# Patient Record
Sex: Female | Born: 1965 | Race: Black or African American | Hispanic: No | Marital: Single | Smoking: Never smoker
Health system: Southern US, Community
[De-identification: ages and names within clinical notes are randomized; demographics above are authoritative.]

## PROBLEM LIST (undated history)

## (undated) DIAGNOSIS — IMO0002 Reserved for concepts with insufficient information to code with codable children: Secondary | ICD-10-CM

## (undated) DIAGNOSIS — Z8739 Personal history of other diseases of the musculoskeletal system and connective tissue: Secondary | ICD-10-CM

## (undated) DIAGNOSIS — N281 Cyst of kidney, acquired: Secondary | ICD-10-CM

## (undated) DIAGNOSIS — J349 Unspecified disorder of nose and nasal sinuses: Secondary | ICD-10-CM

## (undated) DIAGNOSIS — N62 Hypertrophy of breast: Secondary | ICD-10-CM

## (undated) DIAGNOSIS — G500 Trigeminal neuralgia: Secondary | ICD-10-CM

## (undated) DIAGNOSIS — E039 Hypothyroidism, unspecified: Secondary | ICD-10-CM

## (undated) DIAGNOSIS — R51 Headache: Secondary | ICD-10-CM

## (undated) DIAGNOSIS — L508 Other urticaria: Secondary | ICD-10-CM

## (undated) DIAGNOSIS — E559 Vitamin D deficiency, unspecified: Secondary | ICD-10-CM

## (undated) DIAGNOSIS — J302 Other seasonal allergic rhinitis: Secondary | ICD-10-CM

## (undated) DIAGNOSIS — E079 Disorder of thyroid, unspecified: Secondary | ICD-10-CM

## (undated) DIAGNOSIS — D563 Thalassemia minor: Secondary | ICD-10-CM

## (undated) DIAGNOSIS — Z8669 Personal history of other diseases of the nervous system and sense organs: Secondary | ICD-10-CM

## (undated) DIAGNOSIS — D219 Benign neoplasm of connective and other soft tissue, unspecified: Secondary | ICD-10-CM

## (undated) DIAGNOSIS — Z8742 Personal history of other diseases of the female genital tract: Secondary | ICD-10-CM

## (undated) DIAGNOSIS — D649 Anemia, unspecified: Secondary | ICD-10-CM

## (undated) DIAGNOSIS — M545 Low back pain, unspecified: Secondary | ICD-10-CM

## (undated) DIAGNOSIS — B373 Candidiasis of vulva and vagina: Secondary | ICD-10-CM

## (undated) DIAGNOSIS — I10 Essential (primary) hypertension: Secondary | ICD-10-CM

## (undated) DIAGNOSIS — R87619 Unspecified abnormal cytological findings in specimens from cervix uteri: Secondary | ICD-10-CM

## (undated) DIAGNOSIS — K219 Gastro-esophageal reflux disease without esophagitis: Secondary | ICD-10-CM

## (undated) DIAGNOSIS — L659 Nonscarring hair loss, unspecified: Secondary | ICD-10-CM

## (undated) HISTORY — DX: Disorder of thyroid, unspecified: E07.9

## (undated) HISTORY — DX: Reserved for concepts with insufficient information to code with codable children: IMO0002

## (undated) HISTORY — DX: Other urticaria: L50.8

## (undated) HISTORY — DX: Benign neoplasm of connective and other soft tissue, unspecified: D21.9

## (undated) HISTORY — DX: Cyst of kidney, acquired: N28.1

## (undated) HISTORY — DX: Vitamin D deficiency, unspecified: E55.9

## (undated) HISTORY — DX: Personal history of other diseases of the nervous system and sense organs: Z86.69

## (undated) HISTORY — PX: ROOT CANAL: SHX2363

## (undated) HISTORY — DX: Nonscarring hair loss, unspecified: L65.9

## (undated) HISTORY — DX: Unspecified disorder of nose and nasal sinuses: J34.9

## (undated) HISTORY — DX: Thalassemia minor: D56.3

## (undated) HISTORY — PX: WISDOM TOOTH EXTRACTION: SHX21

## (undated) HISTORY — DX: Candidiasis of vulva and vagina: B37.3

## (undated) HISTORY — DX: Personal history of other diseases of the female genital tract: Z87.42

## (undated) HISTORY — DX: Anemia, unspecified: D64.9

## (undated) HISTORY — DX: Unspecified abnormal cytological findings in specimens from cervix uteri: R87.619

## (undated) HISTORY — PX: OTHER SURGICAL HISTORY: SHX169

## (undated) HISTORY — DX: Personal history of other diseases of the musculoskeletal system and connective tissue: Z87.39

## (undated) HISTORY — PX: NASAL SINUS SURGERY: SHX719

---

## 1997-03-11 HISTORY — PX: FOOT SURGERY: SHX648

## 1997-07-17 ENCOUNTER — Emergency Department (HOSPITAL_COMMUNITY): Admission: EM | Admit: 1997-07-17 | Discharge: 1997-07-17 | Payer: Self-pay | Admitting: Emergency Medicine

## 1998-03-11 HISTORY — PX: MYOMECTOMY: SHX85

## 1998-04-18 ENCOUNTER — Inpatient Hospital Stay (HOSPITAL_COMMUNITY): Admission: RE | Admit: 1998-04-18 | Discharge: 1998-04-20 | Payer: Self-pay | Admitting: Obstetrics and Gynecology

## 1999-03-22 ENCOUNTER — Encounter: Payer: Self-pay | Admitting: *Deleted

## 1999-03-22 ENCOUNTER — Encounter: Admission: RE | Admit: 1999-03-22 | Discharge: 1999-03-22 | Payer: Self-pay | Admitting: *Deleted

## 1999-03-30 ENCOUNTER — Encounter: Payer: Self-pay | Admitting: *Deleted

## 1999-03-30 ENCOUNTER — Encounter: Admission: RE | Admit: 1999-03-30 | Discharge: 1999-03-30 | Payer: Self-pay | Admitting: *Deleted

## 1999-10-25 ENCOUNTER — Encounter: Admission: RE | Admit: 1999-10-25 | Discharge: 1999-10-25 | Payer: Self-pay | Admitting: Internal Medicine

## 1999-10-25 ENCOUNTER — Encounter: Payer: Self-pay | Admitting: Internal Medicine

## 1999-10-31 ENCOUNTER — Encounter: Payer: Self-pay | Admitting: Internal Medicine

## 1999-10-31 ENCOUNTER — Encounter: Admission: RE | Admit: 1999-10-31 | Discharge: 1999-10-31 | Payer: Self-pay | Admitting: Internal Medicine

## 2000-01-15 ENCOUNTER — Other Ambulatory Visit: Admission: RE | Admit: 2000-01-15 | Discharge: 2000-01-15 | Payer: Self-pay | Admitting: Obstetrics and Gynecology

## 2000-01-20 ENCOUNTER — Emergency Department (HOSPITAL_COMMUNITY): Admission: EM | Admit: 2000-01-20 | Discharge: 2000-01-20 | Payer: Self-pay | Admitting: Emergency Medicine

## 2000-01-20 ENCOUNTER — Encounter: Payer: Self-pay | Admitting: Emergency Medicine

## 2000-09-04 ENCOUNTER — Encounter: Payer: Self-pay | Admitting: Internal Medicine

## 2000-09-04 ENCOUNTER — Encounter: Admission: RE | Admit: 2000-09-04 | Discharge: 2000-09-04 | Payer: Self-pay | Admitting: Internal Medicine

## 2000-09-09 ENCOUNTER — Encounter: Payer: Self-pay | Admitting: Neurosurgery

## 2000-09-09 ENCOUNTER — Ambulatory Visit (HOSPITAL_COMMUNITY): Admission: RE | Admit: 2000-09-09 | Discharge: 2000-09-09 | Payer: Self-pay | Admitting: Neurosurgery

## 2000-12-09 ENCOUNTER — Encounter: Admission: RE | Admit: 2000-12-09 | Discharge: 2000-12-09 | Payer: Self-pay | Admitting: Internal Medicine

## 2000-12-09 ENCOUNTER — Encounter: Payer: Self-pay | Admitting: Internal Medicine

## 2001-03-09 ENCOUNTER — Other Ambulatory Visit: Admission: RE | Admit: 2001-03-09 | Discharge: 2001-03-09 | Payer: Self-pay | Admitting: Obstetrics and Gynecology

## 2002-04-14 ENCOUNTER — Other Ambulatory Visit: Admission: RE | Admit: 2002-04-14 | Discharge: 2002-04-14 | Payer: Self-pay | Admitting: Obstetrics and Gynecology

## 2002-06-22 ENCOUNTER — Encounter: Payer: Self-pay | Admitting: Neurosurgery

## 2002-06-22 ENCOUNTER — Encounter: Admission: RE | Admit: 2002-06-22 | Discharge: 2002-06-22 | Payer: Self-pay | Admitting: Neurosurgery

## 2002-12-01 ENCOUNTER — Encounter: Admission: RE | Admit: 2002-12-01 | Discharge: 2002-12-01 | Payer: Self-pay | Admitting: Internal Medicine

## 2002-12-01 ENCOUNTER — Encounter: Payer: Self-pay | Admitting: Internal Medicine

## 2003-08-02 ENCOUNTER — Other Ambulatory Visit: Admission: RE | Admit: 2003-08-02 | Discharge: 2003-08-02 | Payer: Self-pay | Admitting: Obstetrics and Gynecology

## 2005-07-08 ENCOUNTER — Other Ambulatory Visit: Admission: RE | Admit: 2005-07-08 | Discharge: 2005-07-08 | Payer: Self-pay | Admitting: Obstetrics and Gynecology

## 2005-11-22 ENCOUNTER — Encounter: Admission: RE | Admit: 2005-11-22 | Discharge: 2005-11-22 | Payer: Self-pay | Admitting: Internal Medicine

## 2008-03-11 DIAGNOSIS — B373 Candidiasis of vulva and vagina: Secondary | ICD-10-CM

## 2008-03-11 DIAGNOSIS — B3731 Acute candidiasis of vulva and vagina: Secondary | ICD-10-CM

## 2008-03-11 HISTORY — DX: Acute candidiasis of vulva and vagina: B37.31

## 2008-03-11 HISTORY — DX: Candidiasis of vulva and vagina: B37.3

## 2008-09-13 ENCOUNTER — Encounter: Admission: RE | Admit: 2008-09-13 | Discharge: 2008-09-13 | Payer: Self-pay | Admitting: Obstetrics and Gynecology

## 2009-09-13 ENCOUNTER — Encounter: Admission: RE | Admit: 2009-09-13 | Discharge: 2009-09-13 | Payer: Self-pay | Admitting: Obstetrics and Gynecology

## 2009-11-30 ENCOUNTER — Encounter: Admission: RE | Admit: 2009-11-30 | Discharge: 2009-11-30 | Payer: Self-pay | Admitting: Internal Medicine

## 2010-08-20 ENCOUNTER — Other Ambulatory Visit: Payer: Self-pay | Admitting: Obstetrics and Gynecology

## 2010-08-20 DIAGNOSIS — Z1231 Encounter for screening mammogram for malignant neoplasm of breast: Secondary | ICD-10-CM

## 2010-09-14 ENCOUNTER — Ambulatory Visit
Admission: RE | Admit: 2010-09-14 | Discharge: 2010-09-14 | Disposition: A | Discharge status: Home / Self Care | Payer: Self-pay | Source: Ambulatory Visit | Attending: Obstetrics and Gynecology | Admitting: Obstetrics and Gynecology

## 2010-09-14 DIAGNOSIS — Z1231 Encounter for screening mammogram for malignant neoplasm of breast: Secondary | ICD-10-CM

## 2011-02-06 DIAGNOSIS — J019 Acute sinusitis, unspecified: Secondary | ICD-10-CM | POA: Insufficient documentation

## 2011-02-06 DIAGNOSIS — J329 Chronic sinusitis, unspecified: Secondary | ICD-10-CM | POA: Insufficient documentation

## 2011-05-03 DIAGNOSIS — K219 Gastro-esophageal reflux disease without esophagitis: Secondary | ICD-10-CM | POA: Insufficient documentation

## 2011-07-16 ENCOUNTER — Telehealth: Payer: Self-pay | Admitting: Obstetrics and Gynecology

## 2011-07-19 ENCOUNTER — Telehealth: Payer: Self-pay | Admitting: Obstetrics and Gynecology

## 2011-07-26 NOTE — Telephone Encounter (Signed)
Schedule surgery and an office visit.  AVS

## 2011-08-01 ENCOUNTER — Telehealth: Payer: Self-pay | Admitting: Obstetrics and Gynecology

## 2011-08-01 ENCOUNTER — Other Ambulatory Visit: Payer: Self-pay | Admitting: Obstetrics and Gynecology

## 2011-08-01 NOTE — Telephone Encounter (Signed)
LAVH scheduled for 08/29/11 @ 9:30 with AVS/VH.  UMR effective 09/08/09.  Plan pays 80/20 after a $500 deductible. Pre-op due $331.29  -Adrianne Pridgen

## 2011-08-12 DIAGNOSIS — D219 Benign neoplasm of connective and other soft tissue, unspecified: Secondary | ICD-10-CM | POA: Insufficient documentation

## 2011-08-12 DIAGNOSIS — Z8742 Personal history of other diseases of the female genital tract: Secondary | ICD-10-CM | POA: Insufficient documentation

## 2011-08-12 DIAGNOSIS — E079 Disorder of thyroid, unspecified: Secondary | ICD-10-CM | POA: Insufficient documentation

## 2011-08-12 DIAGNOSIS — Z8739 Personal history of other diseases of the musculoskeletal system and connective tissue: Secondary | ICD-10-CM | POA: Insufficient documentation

## 2011-08-13 ENCOUNTER — Encounter: Payer: Commercial Managed Care - PPO | Admitting: Obstetrics and Gynecology

## 2011-08-13 ENCOUNTER — Telehealth: Payer: Self-pay | Admitting: Obstetrics and Gynecology

## 2011-08-13 NOTE — Telephone Encounter (Signed)
TC to pt.   States wants to speak with Dr AVS.  Declined to give further information.   Will send message to Dr AVS.

## 2011-08-14 ENCOUNTER — Telehealth: Payer: Self-pay | Admitting: Obstetrics and Gynecology

## 2011-08-19 ENCOUNTER — Encounter: Payer: Self-pay | Admitting: Obstetrics and Gynecology

## 2011-08-19 ENCOUNTER — Other Ambulatory Visit (HOSPITAL_COMMUNITY): Payer: Self-pay

## 2011-08-29 ENCOUNTER — Encounter (HOSPITAL_COMMUNITY): Admission: RE | Payer: Self-pay | Source: Ambulatory Visit

## 2011-08-29 ENCOUNTER — Ambulatory Visit (HOSPITAL_COMMUNITY)
Admission: RE | Admit: 2011-08-29 | Payer: Commercial Managed Care - PPO | Source: Ambulatory Visit | Admitting: Obstetrics and Gynecology

## 2011-08-29 SURGERY — HYSTERECTOMY, VAGINAL, LAPAROSCOPY-ASSISTED
Anesthesia: General

## 2011-09-03 ENCOUNTER — Telehealth: Payer: Self-pay | Admitting: Obstetrics and Gynecology

## 2011-09-03 NOTE — Telephone Encounter (Signed)
LAVH rescheduled to 10/17/18/13 @ 12:00 with AVS/AR  UMR effective 09/08/09.  Plan pays 80/20 after a $500 deductible. Pre-op due $331.29  -Adrianne Pridgen

## 2011-09-04 ENCOUNTER — Other Ambulatory Visit: Payer: Self-pay | Admitting: Obstetrics and Gynecology

## 2011-09-09 ENCOUNTER — Other Ambulatory Visit: Payer: Self-pay | Admitting: Internal Medicine

## 2011-09-09 ENCOUNTER — Encounter: Payer: Self-pay | Admitting: Obstetrics and Gynecology

## 2011-09-09 ENCOUNTER — Ambulatory Visit (INDEPENDENT_AMBULATORY_CARE_PROVIDER_SITE_OTHER): Payer: Commercial Managed Care - PPO | Admitting: Obstetrics and Gynecology

## 2011-09-09 ENCOUNTER — Other Ambulatory Visit: Payer: Self-pay | Admitting: Obstetrics and Gynecology

## 2011-09-09 VITALS — BP 128/84 | Temp 98.3°F | Resp 16 | Ht 64.0 in | Wt 239.0 lb

## 2011-09-09 DIAGNOSIS — Z1231 Encounter for screening mammogram for malignant neoplasm of breast: Secondary | ICD-10-CM

## 2011-09-09 DIAGNOSIS — Z01419 Encounter for gynecological examination (general) (routine) without abnormal findings: Secondary | ICD-10-CM

## 2011-09-09 NOTE — Progress Notes (Addendum)
Last Pap:09/14/2010 WNL: Yes Regular Periods:no Contraception: None  Monthly Breast exam:no Tetanus<28yrs:no Nl.Bladder Function:no/due to fibroids Daily BMs:yes Healthy Diet:no Calcium:yes Mammogram:yes Date of Mammogram: 09/14/2010 Exercise:yes Have often Exercise: Everyday Seatbelt: yes Abuse at home: no Stressful work:yes Sigmoid-colonoscopy: Never had Bone Density: No PCP: Dr. Eula Listen Change in PMH: No Change in [facility]: No  *Declines pap smear today but desires to discuss pre-op C/O pelvic pain due to fibroids   Subjective:    Michele Bean is a 46 y.o. female G0P0 who presents for annual exam. The patient complains of low back pain. She has had disc problems with her back.  She has a known history of fibroids.  She had a myomectomy in 2000.  The following portions of the patient's history were reviewed and updated as appropriate: allergies, current medications, past family history, past medical history, past social history, past surgical history and problem list. See above.  Review of Systems Pertinent items are noted in HPI. Gastrointestinal:No change in bowel habits, no abdominal pain, no rectal bleeding Genitourinary:negative for dysuria, frequency, hematuria, nocturia and urinary incontinence    Objective:     BP 128/84  Temp 98.3 F (36.8 C)  Resp 16  Ht 5\' 4"  (1.626 m)  Wt 239 lb (108.41 kg)  BMI 41.02 kg/m2  LMP 04/15/2011  Weight:  Wt Readings from Last 1 Encounters:  09/09/11 239 lb (108.41 kg)     BMI: Body mass index is 41.02 kg/(m^2). General Appearance: Alert, appropriate appearance for age. No acute distress HEENT: Grossly normal Neck / Thyroid: Supple, no masses, nodes or enlargement Lungs: clear to auscultation bilaterally Back: No CVA tenderness Breast Exam: No masses or nodes.No dimpling, nipple retraction or discharge. Cardiovascular: Regular rate and rhythm. S1, S2, no murmur Gastrointestinal: Soft, non-tender, no masses or  organomegaly  ++++++++++++++++++++++++++++++++++++++++++++++++++++++++  Pelvic Exam: External genitalia: normal general appearance Vaginal: normal mucosa without prolapse or lesions Cervix: normal appearance Adnexa: normal bimanual exam Uterus: 12 week size, irregular, firm Rectovaginal: normal rectal, no masses  ++++++++++++++++++++++++++++++++++++++++++++++++++++++++  Lymphatic Exam: Non-palpable nodes in neck, clavicular, axillary, or inguinal regions  Psychiatric: Alert and oriented, appropriate affect.    Urinalysis:Not done      Assessment:    Normal gyn exam   Fibroid uterus  Low back pain  Hypothyroidism  Low vitamin D  Overweight or obese: Yes  Pelvic relaxation: No  Menopausal symptoms: No. Severe: No.   Plan:    laparoscopy assisted vaginal hysterectomy is scheduled for August 8 of 2013.  Surgical procedure reviewed.   Follow-up:  in 1 month(s) for preop exam. Will review the need for surgery given that she is no longer having heavy bleeding . Patient referred to her family physician to evaluate her constant low back pain and possible disc problems.  STD screen request: none,  RPR: No,  HBsAg: No.  Hepatitis C: No.  The updated Pap smear screening guidelines were discussed with the patient. The patient requested that I obtain a Pap smear: No.  Kegel exercises discussed: No.  Proper diet and regular exercise were reviewed.  Annual mammograms recommended starting at age 11. Proper breast care was discussed.  Screening colonoscopy is recommended beginning at age 66.  Regular health maintenance was reviewed.  Sleep hygiene was discussed.  Adequate calcium and vitamin D intake was emphasized.  [name]

## 2011-09-13 ENCOUNTER — Ambulatory Visit
Admission: RE | Admit: 2011-09-13 | Discharge: 2011-09-13 | Disposition: A | Discharge status: Home / Self Care | Payer: Commercial Managed Care - PPO | Source: Ambulatory Visit | Attending: Internal Medicine | Admitting: Internal Medicine

## 2011-09-13 DIAGNOSIS — Z1231 Encounter for screening mammogram for malignant neoplasm of breast: Secondary | ICD-10-CM

## 2011-09-13 IMAGING — MG MM DIGITAL SCREENING BILAT
6 series · 6 of 6 positions shown · non-contrast
Comparison: Previous exams

CLINICAL DATA: Screening.

DIGITAL SCREENING MAMMOGRAM WITH CAD

[R CC (1 of 2)]
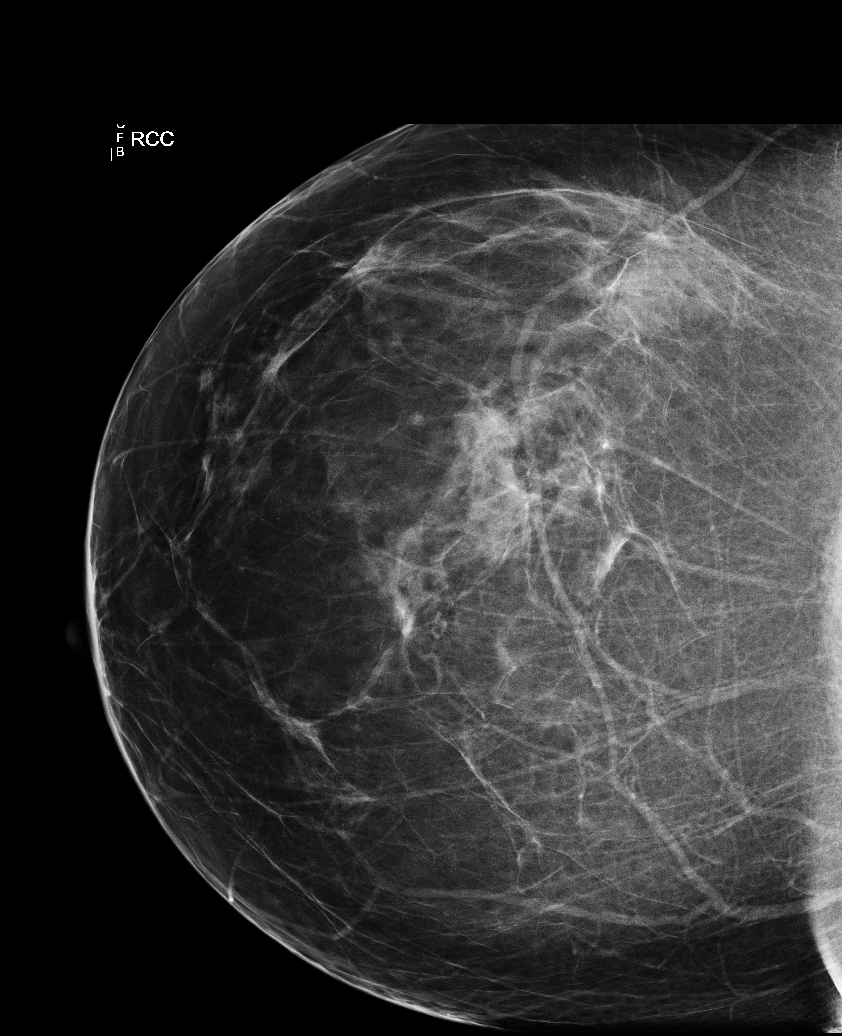

[L CC]
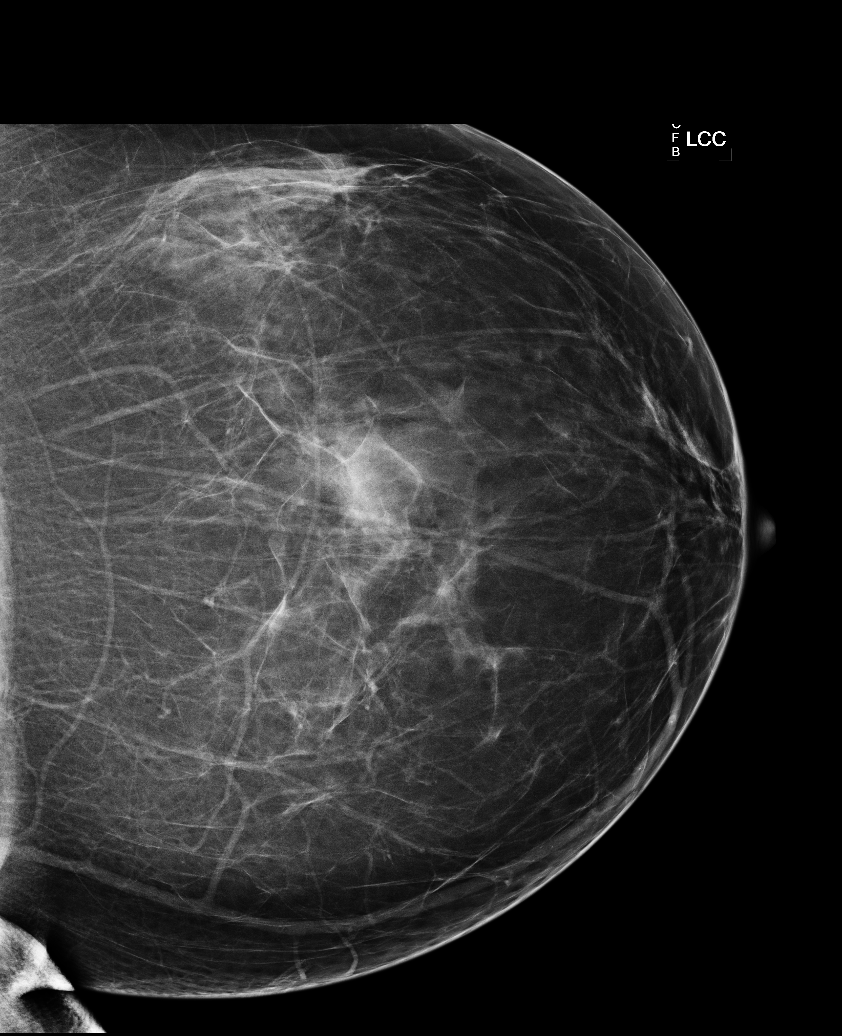

[L MLO]
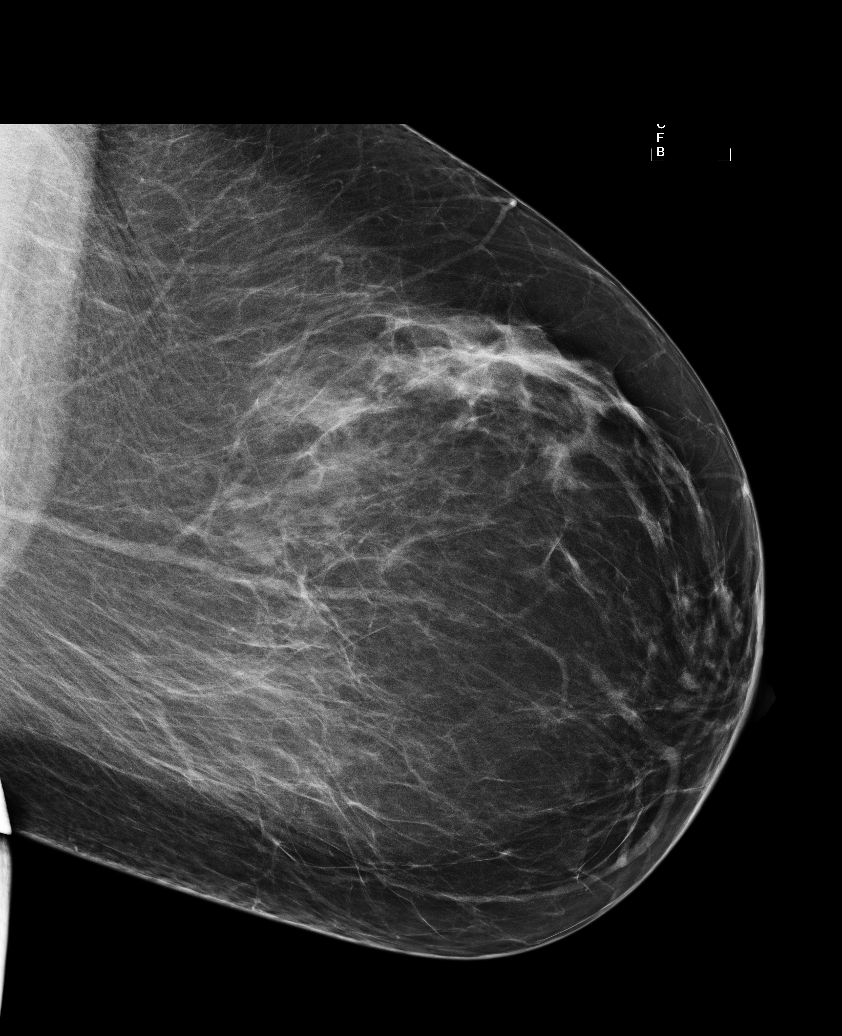

[R MLO (1 of 2)]
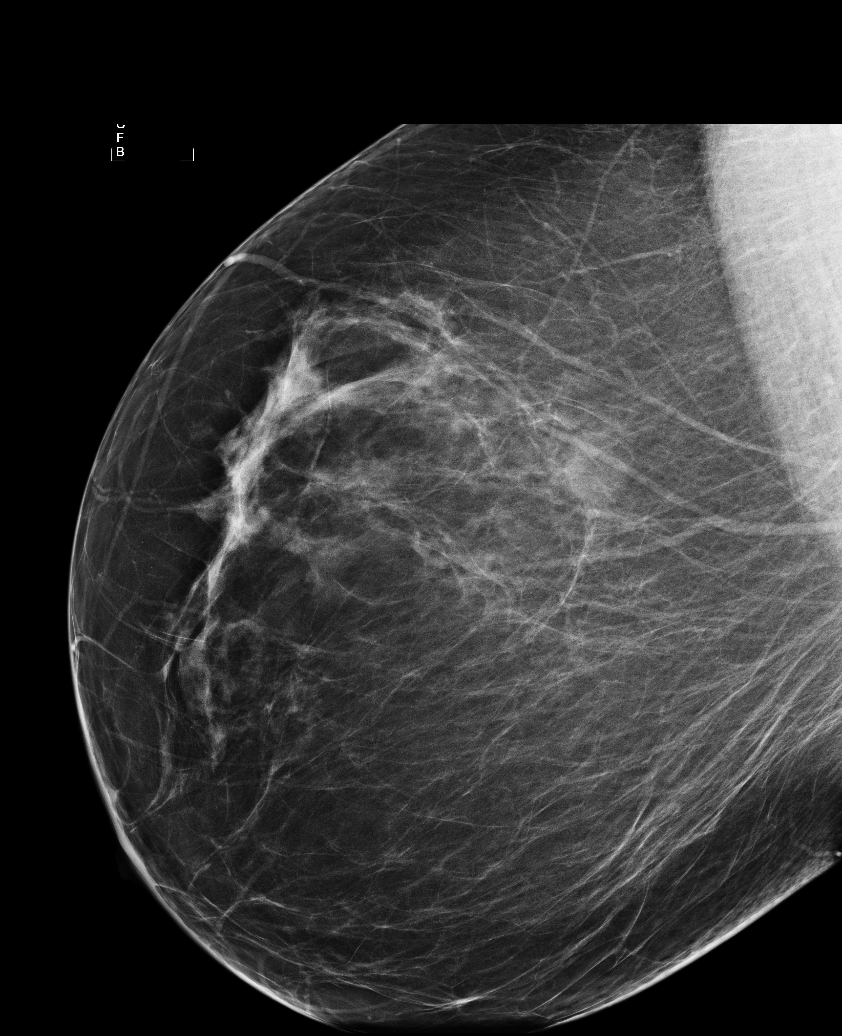

[R MLO (2 of 2)]
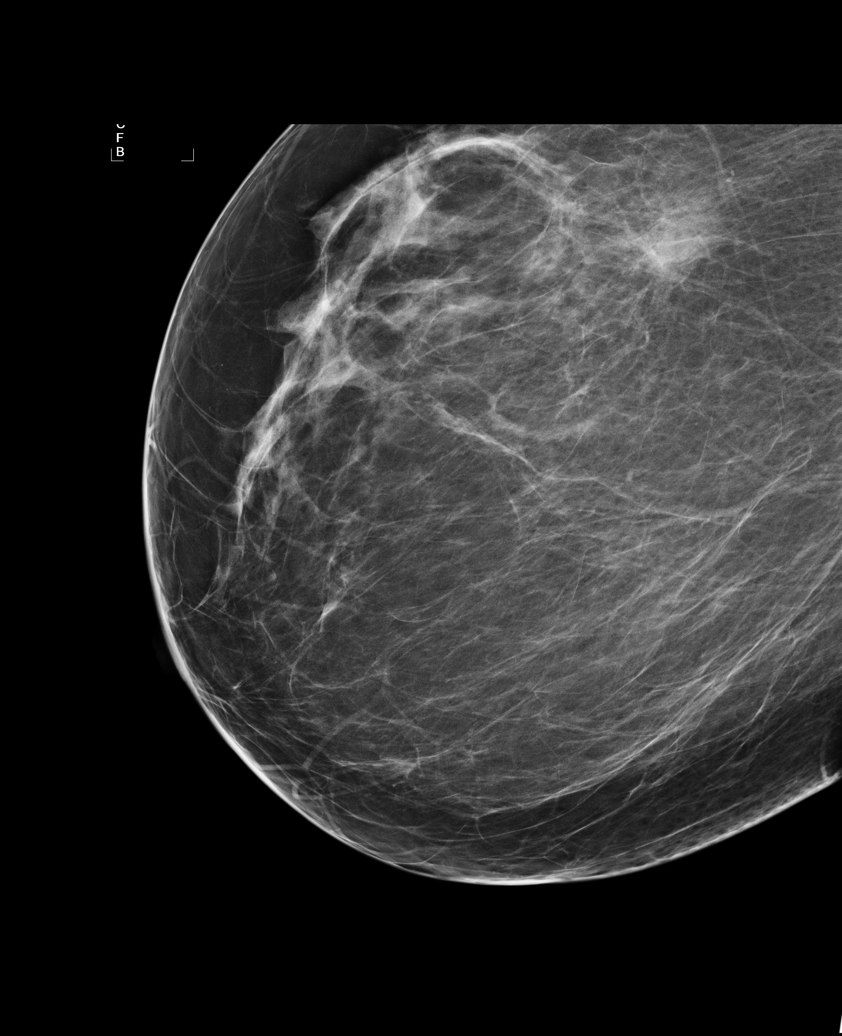

[R CC (2 of 2)]
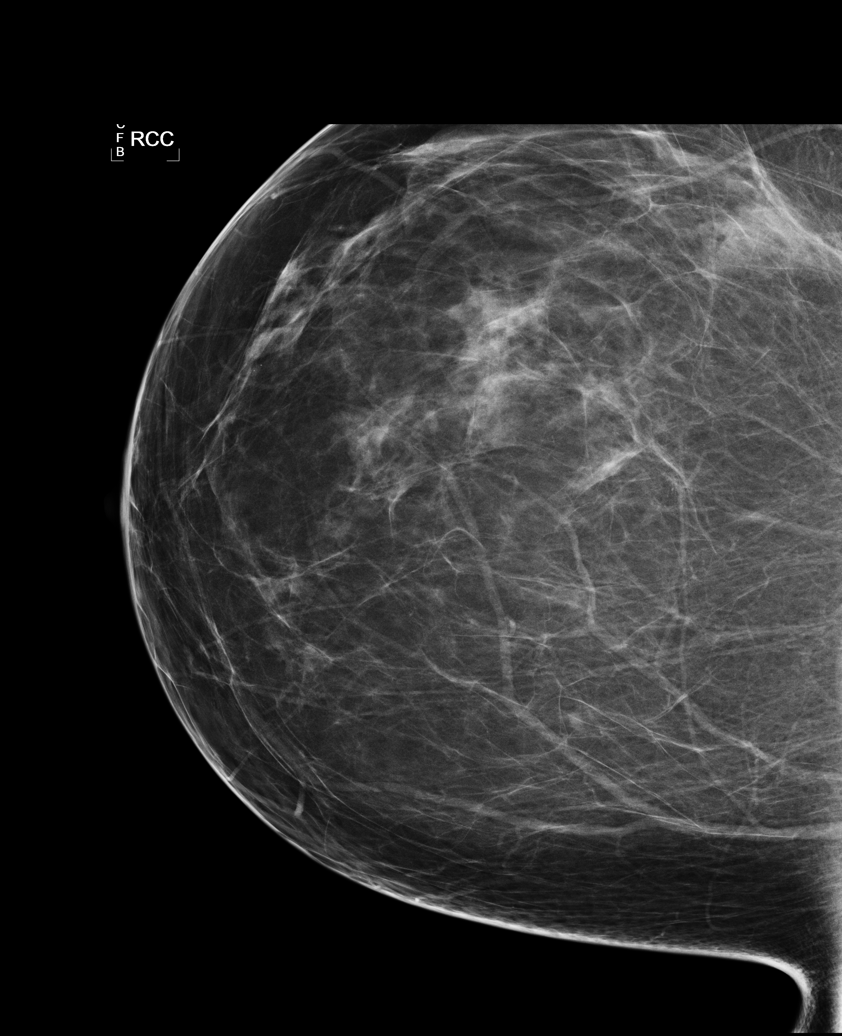

[6 of 6 positions shown; findings below may reference images not displayed]

FINDINGS: There are scattered fibroglandular densities. No
suspicious masses, architectural distortion, or calcifications are
present.

Images were processed with CAD.
IMPRESSION: No specific mammographic evidence of malignancy.

A result letter of this screening mammogram will be mailed directly
to the patient.

RECOMMENDATION:
Screening mammogram in one year. (Code:[C2])

BI-RADS CATEGORY 1:  Negative

## 2011-09-23 ENCOUNTER — Telehealth: Payer: Self-pay | Admitting: Obstetrics and Gynecology

## 2011-09-23 NOTE — Telephone Encounter (Signed)
See telephone call note about robotic hysterectomy.  Patient will have a consultation with Dr. Pennie Rushing.

## 2011-10-01 ENCOUNTER — Other Ambulatory Visit: Payer: Commercial Managed Care - PPO

## 2011-10-01 ENCOUNTER — Encounter: Payer: Commercial Managed Care - PPO | Admitting: Obstetrics and Gynecology

## 2011-10-08 ENCOUNTER — Encounter (HOSPITAL_COMMUNITY)
Admission: RE | Admit: 2011-10-08 | Discharge: 2011-10-08 | Disposition: A | Discharge status: Home / Self Care | Payer: Commercial Managed Care - PPO | Source: Ambulatory Visit | Attending: Obstetrics and Gynecology | Admitting: Obstetrics and Gynecology

## 2011-10-08 ENCOUNTER — Encounter (HOSPITAL_COMMUNITY): Payer: Self-pay

## 2011-10-08 ENCOUNTER — Other Ambulatory Visit: Payer: Commercial Managed Care - PPO

## 2011-10-08 ENCOUNTER — Encounter: Payer: Commercial Managed Care - PPO | Admitting: Obstetrics and Gynecology

## 2011-10-08 ENCOUNTER — Ambulatory Visit (INDEPENDENT_AMBULATORY_CARE_PROVIDER_SITE_OTHER): Payer: Commercial Managed Care - PPO | Admitting: Obstetrics and Gynecology

## 2011-10-08 ENCOUNTER — Encounter: Payer: Self-pay | Admitting: Obstetrics and Gynecology

## 2011-10-08 VITALS — BP 110/74 | HR 72 | Temp 98.0°F | Wt 246.0 lb

## 2011-10-08 DIAGNOSIS — N921 Excessive and frequent menstruation with irregular cycle: Secondary | ICD-10-CM

## 2011-10-08 DIAGNOSIS — D219 Benign neoplasm of connective and other soft tissue, unspecified: Secondary | ICD-10-CM

## 2011-10-08 DIAGNOSIS — N946 Dysmenorrhea, unspecified: Secondary | ICD-10-CM

## 2011-10-08 DIAGNOSIS — D259 Leiomyoma of uterus, unspecified: Secondary | ICD-10-CM

## 2011-10-08 HISTORY — DX: Gastro-esophageal reflux disease without esophagitis: K21.9

## 2011-10-08 HISTORY — DX: Low back pain, unspecified: M54.50

## 2011-10-08 HISTORY — DX: Hypothyroidism, unspecified: E03.9

## 2011-10-08 HISTORY — DX: Headache: R51

## 2011-10-08 HISTORY — DX: Other seasonal allergic rhinitis: J30.2

## 2011-10-08 HISTORY — DX: Low back pain: M54.5

## 2011-10-08 LAB — CBC
HCT: 33.2 % — ABNORMAL LOW (ref 36.0–46.0)
MCHC: 31 g/dL (ref 30.0–36.0)
MCV: 58.5 fL — ABNORMAL LOW (ref 78.0–100.0)
Platelets: 252 10*3/uL (ref 150–400)
RDW: 18.5 % — ABNORMAL HIGH (ref 11.5–15.5)
WBC: 7.2 10*3/uL (ref 4.0–10.5)

## 2011-10-08 LAB — POCT URINE PREGNANCY: Preg Test, Ur: NEGATIVE

## 2011-10-08 LAB — SURGICAL PCR SCREEN: Staphylococcus aureus: NEGATIVE

## 2011-10-08 NOTE — Addendum Note (Signed)
Addended by: Kirkland Hun V on: 10/08/2011 01:00 PM   Modules accepted: Orders

## 2011-10-08 NOTE — Progress Notes (Deleted)
Pt is here today to have pre-op for LAVH on 10/17/2011.

## 2011-10-08 NOTE — Patient Instructions (Addendum)
   Your procedure is scheduled on: Thursday, 10/17/11  Enter through the Main Entrance of [facility] at: 1030 am Pick up the phone at the desk and dial [phone] and inform us of your arrival.  Please call this number if you have any problems the morning of surgery: [phone]  Remember: Do not eat food after midnight: Wednesday Do not drink clear liquids after: 8 am Thursday Take these medicines the morning of surgery with a SIP OF WATER: synthroid, zantac  Do not wear jewelry, make-up, or FINGER nail polish No metal in your hair or on your body. Do not wear lotions, powders, perfumes or deodorant. Do not shave 48 hours prior to surgery. Do not bring valuables to the hospital. Contacts, dentures or bridgework may not be worn into surgery.  Leave suitcase in the car. After Surgery it may be brought to your room. For patients being admitted to the hospital, checkout time is 11:00am the day of discharge.  Home with mother Carletha Dawn.  Remember to use your hibiclens as instructed.Please shower with 1/2 bottle the evening before your surgery and the other 1/2 bottle the morning of surgery. Neck down avoiding private area.

## 2011-10-08 NOTE — Pre-Procedure Instructions (Signed)
Patient states borderline BP - Not Dx Yet - no meds.  Watching BP readings with PCP.

## 2011-10-08 NOTE — Progress Notes (Signed)
HISTORY OF PRESENT ILLNESS  [name] is a 46 y.o. year old female,G0P0, who presents for a problem visit. The patient presents for further discussion about laparoscopy assisted vaginal hysterectomy on August 8 of 2013.  The patient has a prior history of a myomectomy.  She has never had children.  Subjective:  The patient now reports that her primary concern is not heavy bleeding but is dysmenorrhea that she believes is secondary to her fibroids.  Objective:  BP 110/74  Pulse 72  Temp 98 F (36.7 C)  Wt 246 lb (111.585 kg)  LMP 09/25/2011   General: alert and no distress Resp: clear to auscultation bilaterally Cardio: regular rate and rhythm, S1, S2 normal, no murmur, click, rub or gallop GI: soft, non-tender; bowel sounds normal; no masses,  no organomegaly  External genitalia: normal general appearance Vaginal: normal without tenderness, induration or masses Cervix: normal appearance Adnexa: normal bimanual exam Uterus: 10-12 weeks size, difficult to evaluate secondary to an obese abdomen  ENDOMETRIAL BIOPSY PROCEDURE  The indications for the procedure were reviewed with the patient.  All her questions were answered.  A speculum exam was performed.  The cervix and vagina were prepped with multiple layers of Betadine.  Hurricaine gel was applied to the cervix.  A single-tooth tenaculum was required.  The uterus sounded to 9 centimeters.  An adequate sample was obtained.  The patient tolerated her procedure well.  The endometrial sample was sent to pathology.  Assessment:  Fibroids Dysmenorrhea Obesity  Plan:  Endometrial biopsy to pathology. Ultrasound Hysterectomy performed reviewed.  Patient told that a total abdominal hysterectomy may be required.  Risk and benefits outlined.  Return to office prn if symptoms worsen or fail to improve.   Leonard Schwartz M.D.  10/08/2011 12:55 PM

## 2011-10-10 ENCOUNTER — Telehealth: Payer: Self-pay

## 2011-10-10 ENCOUNTER — Other Ambulatory Visit: Payer: Self-pay

## 2011-10-10 LAB — PATHOLOGY

## 2011-10-10 NOTE — Telephone Encounter (Signed)
Pt needs U/S per AVS. Pt has scheduled surgery 10-17-11 "HYST" Pt can only do early appts b/c of her work schedule. U/S was scheduled for 10-17-11 at 8am  I am consulting w/ Dr.Stringer about her possibly being seen by him on this day Since he is not in the office but is on call. I will remove U/S appt tomorrow after speaking to Dr.Stringer if this will not work for him.

## 2011-10-10 NOTE — Telephone Encounter (Signed)
Pt was called to schedule u/s and visit w/ AVS. U/S scheduled for 10-22-11 @ 8:30am then F/U of U/S w/ AVS @ 9am. Pt understood and confirmed appt.  [facility] CMA

## 2011-10-11 ENCOUNTER — Telehealth: Payer: Self-pay

## 2011-10-11 ENCOUNTER — Encounter (HOSPITAL_COMMUNITY): Payer: Self-pay

## 2011-10-11 NOTE — Telephone Encounter (Signed)
Spoke to Dr.Stringer this morning on the phone. He confirmed that he will come in 10-16-11 at 8:30am to F/U after U/S For Mrs.Mingle.  [facility] CMA

## 2011-10-11 NOTE — Telephone Encounter (Signed)
Made Verlan Friends aware appt on 10-16-11 at 8:30am was approved by AVS And slot needs to opened for Ms.Augspurger F/U after U/S. Pt appt was scheduled.  [facility] CMA

## 2011-10-16 ENCOUNTER — Ambulatory Visit (INDEPENDENT_AMBULATORY_CARE_PROVIDER_SITE_OTHER): Payer: Commercial Managed Care - PPO

## 2011-10-16 ENCOUNTER — Ambulatory Visit: Payer: Commercial Managed Care - PPO | Admitting: Obstetrics and Gynecology

## 2011-10-16 ENCOUNTER — Other Ambulatory Visit: Payer: Self-pay | Admitting: Obstetrics and Gynecology

## 2011-10-16 ENCOUNTER — Encounter: Payer: Self-pay | Admitting: Obstetrics and Gynecology

## 2011-10-16 VITALS — BP 126/78 | Resp 16 | Ht 64.0 in | Wt 245.0 lb

## 2011-10-16 DIAGNOSIS — D259 Leiomyoma of uterus, unspecified: Secondary | ICD-10-CM

## 2011-10-16 DIAGNOSIS — N946 Dysmenorrhea, unspecified: Secondary | ICD-10-CM

## 2011-10-16 DIAGNOSIS — D219 Benign neoplasm of connective and other soft tissue, unspecified: Secondary | ICD-10-CM

## 2011-10-16 NOTE — Progress Notes (Signed)
Michele Bean is an 46 y.o. female. 0 P 0-0-0-0.  She has been followed at the [facility]. She has a known history of fibroids.  An ultrasound showed a 12.1 x 8.9 cm uterus.  Multiple fibroids were noted with the largest fibroid measuring 5.1 cm.  The patient complains of back and pelvic discomfort.  An endometrial biopsy was benign.  Gonorrhea and Chlamydia cultures are negative.  Her most recent Pap smear is within normal limits.  The patient had a myomectomy in 2000.  An 11 cm fibroid was removed.     Past Medical History  Diagnosis Date  . Hx of menorrhagia   . Fibroid   . Thyroid dysfunction   . Yeast vaginitis 2010  . Anemia   . H/O low back pain   . Abnormal Pap smear   . Seasonal allergies   . Hypothyroidism   . Lower back pain     tx with naprosyn  . GERD (gastroesophageal reflux disease)   . Headache     OTC meds PRN    Past Surgical History  Procedure Date  . Myomectomy 2000  . Wisdom tooth extraction   . Nasal sinus surgery 2006, 2007    x 2  . Arthroscopic knee surgery     left  . Foot surgery 1999    bilateral - hammer toes    Family History  Problem Relation Age of Onset  . Obesity Father     Social History:  reports that she has never smoked. She has never used smokeless tobacco. She reports that she does not drink alcohol or use illicit drugs.  Allergies: No Known Allergies  Medications: Synthroid, Allegra, vitamins, iron  Review of systems: See history of present illness  Blood pressure 126/78, resp. rate 16, height 5\' 4"  (1.626 m), weight 245 lb (111.131 kg), last menstrual period 09/25/2011. General: alert and cooperative Resp: clear to auscultation bilaterally Cardio: regular rate and rhythm, S1, S2 normal, no murmur, click, rub or gallop GI: soft, non-tender; bowel sounds normal; no masses,  no organomegaly Extremities: extremities normal, atraumatic, no cyanosis or edema neurologic  exam: Grossly normal  External genitalia: normal general appearance Vaginal: normal mucosa without prolapse or lesions Cervix: normal appearance Adnexa: normal bimanual exam Uterus: 12 week size and irregular, poor descent  CBC    Component Value Date/Time   WBC 7.2 10/08/2011 1102   RBC 5.68* 10/08/2011 1102   HGB 10.3* 10/08/2011 1102   HCT 33.2* 10/08/2011 1102   PLT 252 10/08/2011 1102   MCV 58.5* 10/08/2011 1102   MCH 18.1* 10/08/2011 1102   MCHC 31.0 10/08/2011 1102   RDW 18.5* 10/08/2011 1102    Assessment/Plan: Fibroids Anemia Pelvic pressure Back pain Obesity Hypothyroidism  We have planned a laparoscopy assisted vaginal hysterectomy.  The patient understands that we may need to perform a total abdominal hysterectomy.  The patient understands the indications for her surgical procedure.  She accepts the risk of, but not limited to, anesthetic complications, bleeding, infections, and possible damage to surrounding organs.  Dalon Reichart V 10/16/2011, 9:32 AM

## 2011-10-17 ENCOUNTER — Encounter (HOSPITAL_COMMUNITY)
Admission: RE | Disposition: A | Discharge status: Home / Self Care | Payer: Self-pay | Source: Ambulatory Visit | Attending: Obstetrics and Gynecology

## 2011-10-17 ENCOUNTER — Encounter (HOSPITAL_COMMUNITY): Payer: Self-pay | Admitting: Anesthesiology

## 2011-10-17 ENCOUNTER — Ambulatory Visit (HOSPITAL_COMMUNITY): Payer: Commercial Managed Care - PPO | Admitting: Anesthesiology

## 2011-10-17 ENCOUNTER — Inpatient Hospital Stay (HOSPITAL_COMMUNITY)
Admission: RE | Admit: 2011-10-17 | Discharge: 2011-10-19 | DRG: 743 | Disposition: A | Discharge status: Home / Self Care | Payer: Commercial Managed Care - PPO | Source: Ambulatory Visit | Attending: Obstetrics and Gynecology | Admitting: Obstetrics and Gynecology

## 2011-10-17 DIAGNOSIS — D252 Subserosal leiomyoma of uterus: Secondary | ICD-10-CM | POA: Diagnosis present

## 2011-10-17 DIAGNOSIS — Z5331 Laparoscopic surgical procedure converted to open procedure: Secondary | ICD-10-CM

## 2011-10-17 DIAGNOSIS — N8000 Endometriosis of the uterus, unspecified: Secondary | ICD-10-CM | POA: Diagnosis present

## 2011-10-17 DIAGNOSIS — N946 Dysmenorrhea, unspecified: Secondary | ICD-10-CM

## 2011-10-17 DIAGNOSIS — D649 Anemia, unspecified: Secondary | ICD-10-CM | POA: Diagnosis present

## 2011-10-17 DIAGNOSIS — E039 Hypothyroidism, unspecified: Secondary | ICD-10-CM | POA: Diagnosis present

## 2011-10-17 DIAGNOSIS — N840 Polyp of corpus uteri: Secondary | ICD-10-CM | POA: Diagnosis present

## 2011-10-17 DIAGNOSIS — E669 Obesity, unspecified: Secondary | ICD-10-CM | POA: Diagnosis present

## 2011-10-17 DIAGNOSIS — D251 Intramural leiomyoma of uterus: Secondary | ICD-10-CM | POA: Diagnosis present

## 2011-10-17 DIAGNOSIS — N736 Female pelvic peritoneal adhesions (postinfective): Secondary | ICD-10-CM

## 2011-10-17 DIAGNOSIS — D250 Submucous leiomyoma of uterus: Secondary | ICD-10-CM | POA: Diagnosis present

## 2011-10-17 DIAGNOSIS — D259 Leiomyoma of uterus, unspecified: Secondary | ICD-10-CM

## 2011-10-17 HISTORY — PX: ABDOMINAL HYSTERECTOMY: SHX81

## 2011-10-17 HISTORY — PX: LAPAROSCOPIC ASSISTED VAGINAL HYSTERECTOMY: SHX5398

## 2011-10-17 LAB — PREGNANCY, URINE: Preg Test, Ur: NEGATIVE

## 2011-10-17 SURGERY — HYSTERECTOMY, VAGINAL, LAPAROSCOPY-ASSISTED
Anesthesia: General | Site: Abdomen | Wound class: Clean Contaminated

## 2011-10-17 MED ORDER — ROCURONIUM BROMIDE 100 MG/10ML IV SOLN
INTRAVENOUS | Status: DC | PRN
  Administered 2011-10-17 (×5): 10 mg via INTRAVENOUS
  Administered 2011-10-17: 50 mg via INTRAVENOUS

## 2011-10-17 MED ORDER — LIDOCAINE HCL (CARDIAC) 20 MG/ML IV SOLN
INTRAVENOUS | Status: DC | PRN
  Administered 2011-10-17: 100 mg via INTRAVENOUS

## 2011-10-17 MED ORDER — LACTATED RINGERS IR SOLN
Status: DC | PRN
  Administered 2011-10-17: 3000 mL

## 2011-10-17 MED ORDER — DIPHENHYDRAMINE HCL 12.5 MG/5ML PO ELIX: 12.5000 mg | ORAL_SOLUTION | Freq: Four times a day (QID) | ORAL | Status: DC | PRN

## 2011-10-17 MED ORDER — DEXAMETHASONE SODIUM PHOSPHATE 10 MG/ML IJ SOLN
INTRAMUSCULAR | Status: DC | PRN
  Administered 2011-10-17: 4 mg via INTRAVENOUS

## 2011-10-17 MED ORDER — LORATADINE 10 MG PO TABS
10.0000 mg | ORAL_TABLET | Freq: Every day | ORAL | Status: DC
  Administered 2011-10-18 – 2011-10-19 (×2): 10 mg via ORAL
  Filled 2011-10-17 (×3): qty 1

## 2011-10-17 MED ORDER — HYDROMORPHONE HCL PF 1 MG/ML IJ SOLN
INTRAMUSCULAR | Status: AC
  Administered 2011-10-17: 0.25 mg via INTRAVENOUS
  Filled 2011-10-17: qty 1

## 2011-10-17 MED ORDER — ROCURONIUM BROMIDE 50 MG/5ML IV SOLN
INTRAVENOUS | Status: AC
  Filled 2011-10-17: qty 1

## 2011-10-17 MED ORDER — ONDANSETRON HCL 4 MG PO TABS: 4.0000 mg | ORAL_TABLET | Freq: Four times a day (QID) | ORAL | Status: DC | PRN

## 2011-10-17 MED ORDER — IBUPROFEN 800 MG PO TABS
800.0000 mg | ORAL_TABLET | Freq: Three times a day (TID) | ORAL | Status: DC | PRN
  Administered 2011-10-18: 800 mg via ORAL
  Filled 2011-10-17: qty 1

## 2011-10-17 MED ORDER — MIDAZOLAM HCL 2 MG/2ML IJ SOLN
INTRAMUSCULAR | Status: AC
  Filled 2011-10-17: qty 2

## 2011-10-17 MED ORDER — LEVOTHYROXINE SODIUM 50 MCG PO TABS
50.0000 ug | ORAL_TABLET | Freq: Every day | ORAL | Status: DC
  Administered 2011-10-18 – 2011-10-19 (×2): 50 ug via ORAL
  Filled 2011-10-17 (×3): qty 1

## 2011-10-17 MED ORDER — MENTHOL 3 MG MT LOZG: 1.0000 | LOZENGE | OROMUCOSAL | Status: DC | PRN

## 2011-10-17 MED ORDER — LIDOCAINE HCL (CARDIAC) 20 MG/ML IV SOLN
INTRAVENOUS | Status: AC
  Filled 2011-10-17: qty 5

## 2011-10-17 MED ORDER — PROPOFOL 10 MG/ML IV EMUL
INTRAVENOUS | Status: AC
  Filled 2011-10-17: qty 20

## 2011-10-17 MED ORDER — BUPIVACAINE-EPINEPHRINE (PF) 0.5% -1:200000 IJ SOLN
INTRAMUSCULAR | Status: AC
  Filled 2011-10-17: qty 10

## 2011-10-17 MED ORDER — ONDANSETRON HCL 4 MG/2ML IJ SOLN: 4.0000 mg | Freq: Four times a day (QID) | INTRAMUSCULAR | Status: DC | PRN

## 2011-10-17 MED ORDER — NALOXONE HCL 0.4 MG/ML IJ SOLN: 0.4000 mg | INTRAMUSCULAR | Status: DC | PRN

## 2011-10-17 MED ORDER — MIDAZOLAM HCL 5 MG/5ML IJ SOLN
INTRAMUSCULAR | Status: DC | PRN
  Administered 2011-10-17: 1 mg via INTRAVENOUS

## 2011-10-17 MED ORDER — BUPIVACAINE-EPINEPHRINE 0.5% -1:200000 IJ SOLN
INTRAMUSCULAR | Status: DC | PRN
  Administered 2011-10-17: 50 mL

## 2011-10-17 MED ORDER — PROPOFOL 10 MG/ML IV EMUL
INTRAVENOUS | Status: DC | PRN
  Administered 2011-10-17: 200 mg via INTRAVENOUS

## 2011-10-17 MED ORDER — NEOSTIGMINE METHYLSULFATE 1 MG/ML IJ SOLN
INTRAMUSCULAR | Status: AC
  Filled 2011-10-17: qty 10

## 2011-10-17 MED ORDER — HYDROMORPHONE 0.3 MG/ML IV SOLN
INTRAVENOUS | Status: DC
  Administered 2011-10-17: 0.6 mg via INTRAVENOUS
  Administered 2011-10-17: 17:00:00 via INTRAVENOUS
  Administered 2011-10-18: 0.3 mg via INTRAVENOUS
  Filled 2011-10-17: qty 25

## 2011-10-17 MED ORDER — SODIUM CHLORIDE 0.9 % IJ SOLN: 9.0000 mL | INTRAMUSCULAR | Status: DC | PRN

## 2011-10-17 MED ORDER — PHENYLEPHRINE 40 MCG/ML (10ML) SYRINGE FOR IV PUSH (FOR BLOOD PRESSURE SUPPORT)
PREFILLED_SYRINGE | INTRAVENOUS | Status: AC
  Filled 2011-10-17: qty 5

## 2011-10-17 MED ORDER — HYDROMORPHONE HCL PF 1 MG/ML IJ SOLN
INTRAMUSCULAR | Status: DC | PRN
  Administered 2011-10-17 (×2): 0.5 mg via INTRAVENOUS

## 2011-10-17 MED ORDER — FENTANYL CITRATE 0.05 MG/ML IJ SOLN
INTRAMUSCULAR | Status: DC | PRN
  Administered 2011-10-17: 150 ug via INTRAVENOUS
  Administered 2011-10-17 (×2): 100 ug via INTRAVENOUS

## 2011-10-17 MED ORDER — GLYCOPYRROLATE 0.2 MG/ML IJ SOLN
INTRAMUSCULAR | Status: AC
  Filled 2011-10-17: qty 3

## 2011-10-17 MED ORDER — HYDROMORPHONE HCL PF 1 MG/ML IJ SOLN
0.2500 mg | INTRAMUSCULAR | Status: DC | PRN
  Administered 2011-10-17 (×2): 0.25 mg via INTRAVENOUS

## 2011-10-17 MED ORDER — DIPHENHYDRAMINE HCL 50 MG/ML IJ SOLN: 12.5000 mg | Freq: Four times a day (QID) | INTRAMUSCULAR | Status: DC | PRN

## 2011-10-17 MED ORDER — MEPERIDINE HCL 25 MG/ML IJ SOLN: 6.2500 mg | INTRAMUSCULAR | Status: DC | PRN

## 2011-10-17 MED ORDER — ONDANSETRON HCL 4 MG/2ML IJ SOLN
INTRAMUSCULAR | Status: AC
  Filled 2011-10-17: qty 2

## 2011-10-17 MED ORDER — SIMETHICONE 80 MG PO CHEW: 80.0000 mg | CHEWABLE_TABLET | Freq: Four times a day (QID) | ORAL | Status: DC | PRN

## 2011-10-17 MED ORDER — GUAIFENESIN 100 MG/5ML PO SOLN: 15.0000 mL | ORAL | Status: DC | PRN

## 2011-10-17 MED ORDER — CEFAZOLIN SODIUM-DEXTROSE 2-3 GM-% IV SOLR
2.0000 g | INTRAVENOUS | Status: AC
  Administered 2011-10-17: 2 g via INTRAVENOUS

## 2011-10-17 MED ORDER — KETOROLAC TROMETHAMINE 30 MG/ML IJ SOLN
30.0000 mg | Freq: Four times a day (QID) | INTRAMUSCULAR | Status: AC
  Administered 2011-10-17 – 2011-10-18 (×4): 30 mg via INTRAVENOUS
  Filled 2011-10-17 (×4): qty 1

## 2011-10-17 MED ORDER — GLYCOPYRROLATE 0.2 MG/ML IJ SOLN
INTRAMUSCULAR | Status: DC | PRN
Start: 2011-10-17 — End: 2011-10-17
  Administered 2011-10-17: 0.6 mg via INTRAVENOUS

## 2011-10-17 MED ORDER — METOCLOPRAMIDE HCL 5 MG/ML IJ SOLN: 10.0000 mg | Freq: Once | INTRAMUSCULAR | Status: DC | PRN

## 2011-10-17 MED ORDER — SCOPOLAMINE 1 MG/3DAYS TD PT72
MEDICATED_PATCH | TRANSDERMAL | Status: AC
  Administered 2011-10-17: 1.5 mg via TRANSDERMAL
  Filled 2011-10-17: qty 1

## 2011-10-17 MED ORDER — DEXTROSE-NACL 5-0.45 % IV SOLN
INTRAVENOUS | Status: DC
  Administered 2011-10-17 – 2011-10-19 (×5): via INTRAVENOUS

## 2011-10-17 MED ORDER — PHENYLEPHRINE HCL 10 MG/ML IJ SOLN
INTRAMUSCULAR | Status: DC | PRN
  Administered 2011-10-17: 40 ug via INTRAVENOUS

## 2011-10-17 MED ORDER — OXYCODONE-ACETAMINOPHEN 5-325 MG PO TABS
1.0000 | ORAL_TABLET | ORAL | Status: DC | PRN
  Administered 2011-10-18: 1 via ORAL
  Filled 2011-10-17: qty 1

## 2011-10-17 MED ORDER — NEOSTIGMINE METHYLSULFATE 1 MG/ML IJ SOLN
INTRAMUSCULAR | Status: DC | PRN
  Administered 2011-10-17: 3 mg via INTRAVENOUS

## 2011-10-17 MED ORDER — CEFAZOLIN SODIUM-DEXTROSE 2-3 GM-% IV SOLR
INTRAVENOUS | Status: AC
  Filled 2011-10-17: qty 50

## 2011-10-17 MED ORDER — FENTANYL CITRATE 0.05 MG/ML IJ SOLN
INTRAMUSCULAR | Status: AC
  Filled 2011-10-17: qty 5

## 2011-10-17 MED ORDER — SCOPOLAMINE 1 MG/3DAYS TD PT72
1.0000 | MEDICATED_PATCH | Freq: Once | TRANSDERMAL | Status: DC
  Administered 2011-10-17: 1.5 mg via TRANSDERMAL

## 2011-10-17 MED ORDER — KETOROLAC TROMETHAMINE 30 MG/ML IJ SOLN
INTRAMUSCULAR | Status: DC | PRN
  Administered 2011-10-17: 30 mg via INTRAMUSCULAR
  Administered 2011-10-17: 30 mg via INTRAVENOUS

## 2011-10-17 MED ORDER — LACTATED RINGERS IV SOLN
INTRAVENOUS | Status: DC
  Administered 2011-10-17 (×4): via INTRAVENOUS

## 2011-10-17 MED ORDER — KETOROLAC TROMETHAMINE 30 MG/ML IJ SOLN
INTRAMUSCULAR | Status: AC
  Filled 2011-10-17: qty 2

## 2011-10-17 MED ORDER — HYDROMORPHONE HCL PF 1 MG/ML IJ SOLN
INTRAMUSCULAR | Status: AC
  Filled 2011-10-17: qty 1

## 2011-10-17 MED ORDER — ONDANSETRON HCL 4 MG/2ML IJ SOLN
INTRAMUSCULAR | Status: DC | PRN
  Administered 2011-10-17: 4 mg via INTRAVENOUS

## 2011-10-17 SURGICAL SUPPLY — 64 items
CABLE HIGH FREQUENCY MONO STRZ (ELECTRODE) IMPLANT
CANISTER SUCTION 2500CC (MISCELLANEOUS) ×2 IMPLANT
CATH FOLEY 3WAY 30CC 16FR (CATHETERS) IMPLANT
CATH ROBINSON RED A/P 16FR (CATHETERS) IMPLANT
CHLORAPREP W/TINT 26ML (MISCELLANEOUS) ×3 IMPLANT
CLOTH BEACON ORANGE TIMEOUT ST (SAFETY) ×3 IMPLANT
CONT PATH 16OZ SNAP LID 3702 (MISCELLANEOUS) ×3 IMPLANT
COVER TABLE BACK 60X90 (DRAPES) ×2 IMPLANT
DECANTER SPIKE VIAL GLASS SM (MISCELLANEOUS) ×1 IMPLANT
DRAPE CESAREAN BIRTH W POUCH (DRAPES) IMPLANT
DRAPE PROXIMA HALF (DRAPES) ×2 IMPLANT
ELECT REM PT RETURN 9FT ADLT (ELECTROSURGICAL) ×2
ELECTRODE REM PT RTRN 9FT ADLT (ELECTROSURGICAL) ×1 IMPLANT
FORMULA 20CAL 3 OZ MEAD (FORMULA) IMPLANT
GAUZE SPONGE 4X4 16PLY XRAY LF (GAUZE/BANDAGES/DRESSINGS) ×3 IMPLANT
GAUZE VASELINE 3X9 (GAUZE/BANDAGES/DRESSINGS) IMPLANT
GLOVE BIOGEL PI IND STRL 6.5 (GLOVE) IMPLANT
GLOVE BIOGEL PI IND STRL 8.5 (GLOVE) ×3 IMPLANT
GLOVE BIOGEL PI INDICATOR 6.5 (GLOVE) ×1
GLOVE BIOGEL PI INDICATOR 8.5 (GLOVE) ×3
GLOVE ECLIPSE 8.0 STRL XLNG CF (GLOVE) ×10 IMPLANT
GOWN PREVENTION PLUS LG XLONG (DISPOSABLE) ×9 IMPLANT
GOWN PREVENTION PLUS XXLARGE (GOWN DISPOSABLE) ×2 IMPLANT
NDL HYPO 18GX1.5 BLUNT FILL (NEEDLE) IMPLANT
NDL HYPO 25X1 1.5 SAFETY (NEEDLE) IMPLANT
NEEDLE HYPO 18GX1.5 BLUNT FILL (NEEDLE) IMPLANT
NEEDLE HYPO 25X1 1.5 SAFETY (NEEDLE) IMPLANT
NEEDLE MAYO .5 CIRCLE (NEEDLE) ×2 IMPLANT
NS IRRIG 1000ML POUR BTL (IV SOLUTION) ×3 IMPLANT
PACK ABDOMINAL GYN (CUSTOM PROCEDURE TRAY) ×1 IMPLANT
PACK LAVH (CUSTOM PROCEDURE TRAY) ×2 IMPLANT
PAD OB MATERNITY 4.3X12.25 (PERSONAL CARE ITEMS) ×2 IMPLANT
PLUG CATH AND CAP STER (CATHETERS) IMPLANT
PROTECTOR NERVE ULNAR (MISCELLANEOUS) ×4 IMPLANT
SET IRRIG TUBING LAPAROSCOPIC (IRRIGATION / IRRIGATOR) ×1 IMPLANT
SOLUTION ELECTROLUBE (MISCELLANEOUS) ×2 IMPLANT
SPONGE LAP 18X18 X RAY DECT (DISPOSABLE) ×4 IMPLANT
STAPLER VISISTAT 35W (STAPLE) ×2 IMPLANT
STRIP CLOSURE SKIN 1/4X3 (GAUZE/BANDAGES/DRESSINGS) IMPLANT
SUT CHROMIC 1 CT1 27 (SUTURE) IMPLANT
SUT CHROMIC 3 0 SH 27 (SUTURE) ×2 IMPLANT
SUT MNCRL AB 3-0 PS2 27 (SUTURE) ×2 IMPLANT
SUT PDS AB 1 CTX 36 (SUTURE) IMPLANT
SUT VIC AB 0 CT1 18XCR BRD8 (SUTURE) ×6 IMPLANT
SUT VIC AB 0 CT1 27 (SUTURE) ×4
SUT VIC AB 0 CT1 27XBRD ANBCTR (SUTURE) ×5 IMPLANT
SUT VIC AB 0 CT1 8-18 (SUTURE) ×6
SUT VIC AB 2-0 CT1 27 (SUTURE)
SUT VIC AB 2-0 CT1 TAPERPNT 27 (SUTURE) IMPLANT
SUT VIC AB 2-0 SH 27 (SUTURE) ×2
SUT VIC AB 2-0 SH 27XBRD (SUTURE) ×1 IMPLANT
SUT VIC AB 3-0 CT1 27 (SUTURE) ×2
SUT VIC AB 3-0 CT1 TAPERPNT 27 (SUTURE) ×1 IMPLANT
SUT VIC AB 4-0 PS2 27 (SUTURE) IMPLANT
SUT VICRYL 0 ENDOLOOP (SUTURE) IMPLANT
SUT VICRYL 0 TIES 12 18 (SUTURE) ×3 IMPLANT
SUT VICRYL 0 UR6 27IN ABS (SUTURE) ×4 IMPLANT
SYR 30ML LL (SYRINGE) IMPLANT
SYR 50ML LL SCALE MARK (SYRINGE) ×2 IMPLANT
SYR CONTROL 10ML LL (SYRINGE) ×1 IMPLANT
TOWEL OR 17X24 6PK STRL BLUE (TOWEL DISPOSABLE) ×6 IMPLANT
TRAY FOLEY CATH 14FR (SET/KITS/TRAYS/PACK) ×3 IMPLANT
WARMER LAPAROSCOPE (MISCELLANEOUS) ×2 IMPLANT
WATER STERILE IRR 1000ML POUR (IV SOLUTION) ×2 IMPLANT

## 2011-10-17 NOTE — Op Note (Signed)
Michele Bean  DOB:    1965/03/20  MRN:    [id number]  CSN:    [id number]  Date of Surgery:  10/17/2011  Preoperative Diagnosis:  Dysmenorrhea Fibroid uterus Obesity Anemia  Postoperative Diagnosis:  Dysmenorrhea Fibroid uterus Obesity Anemia Pelvic adhesions  Procedure:  Laparoscopy Laparoscopic lysis of adhesions Total abdominal hysterectomy Bilateral salpingectomy  Surgeon:  Leonard Schwartz, M.D.  Assistant:  Dr. Osborn Coho  Anesthetic:  General  Disposition:  The patient is a 46 year old who presents with the above-mentioned diagnosis. She understands the indications for her surgical procedure. She accepts the risk of, but not limited to, anesthetic complications, bleeding, infections, and possible damage to the surrounding organs. A laparoscopy assisted vaginal hysterectomy is planned. She understands that we may need to proceed with an abdominal approach because of her large uterus and because she has never had children. There is poor descent of the uterus.  Findings:  A 14 week size multi-fibroid uterus was present. There were thick adhesions between the small bowel, the anterior uterus, and the anterior abdominal wall. There were moderate adhesions between the anterior uterus and the anterior peritoneum at the level of the bladder flap. The fallopian tubes and the ovaries appeared normal bilaterally. No other pathology was appreciated in the pelvis.  Procedure:  The patient was taken to the operating room where a general anesthetic was given. The patient's abdomen was prepped with ChloraPrep. The perineum and vagina were prepped with Betadine. An examination under anesthesia was performed. A Foley catheter was placed in the bladder. A Hulka tenaculum was placed inside the uterus. The patient was sterilely draped. The subumbilical area was injected with half percent Marcaine and epinephrine. An incision was made. The varies  needle was inserted into the abdominal cavity without difficulty. Proper placement was confirmed using the saline drop test. A pneumoperitoneum was obtained after the laparoscopic trocar was inserted. The laparoscope was placed. The pelvis was visualized with findings as mentioned above. The lower abdomen was injected with Marcaine and epinephrine. Two small incisions were made. Two 5 mm trochars were placed under direct visualization. Pictures were taken of the abdominal cavity. Sharp dissection was used to dissect the bowel off the anterior abdominal wall. Care was taken not to damage the bowel. We then used sharp and blunt dissection to dissect the bowel off the anterior uterus. Again care was taken not to damage the bowel. Bleeding was noted on the anterior surface of the uterus. Hemostasis was achieved using the bipolar cautery. Sharp dissection was then used to develop the bladder flap and to remove the adhesions to the anterior abdominal wall. The uterus was then elevated the pelvis. The uterus was noted to be 14 week size. There was a broad lateral diameter to the uterus as well as multiple fundal fibroids. There was very poor mobility of the uterus. The decision was made to proceed with the abdominal approach. All instruments were removed. The patient was placed in a supine position. The lower abdomen was injected with 10 cc of half percent Marcaine with epinephrine. A low transverse incision was made in the abdomen. The incision was carried sharply through the subcutaneous tissue, the fascia, and the anterior peritoneum. The bowel was packed out of the pelvis. An abdominal wall retractor was placed. The uterus was elevated into the Michele field. Again, there was very poor mobility. The round ligaments were identified bilaterally. The round ligaments were then clamped, cut, and suture ligated. The bladder flap was  developed anteriorly. The uterine arteries were skeletonized. Alternating from left to  right, the uterine arteries, parametrial tissues, and paracervical tissues were clamped, cut, sutured, and tied securely. The fundus of the uterus was transected from the cervix to aid with visualization. The remainder of the paracervical tissues, the uterosacral ligaments, and the vaginal angles were clamped, cut, sutured, and tied securely. The cervix was transected from the apex of the vagina. The apex of the vagina was closed using figure-of-eight sutures incorporating the anterior and posterior surfaces. The pelvis was vigorously irrigated. Hemostasis was achieved. There was no evidence of damage to the pelvic or abdominal structures. We then clamped the mesosalpinx bilaterally. The fallopian tubes were removed. The mesosalpinx were secured using free ties and suture ligatures. A final check was made for hemostasis and hemostasis was confirmed. The pelvis was vigorously irrigated. The anterior peritoneum was closed using a running suture of 0 Vicryl. The abdominal musculature was reapproximated in the midline using 0 Vicryl. The fascia was closed using 2 running sutures from the corners to the midline. The subcutaneous tissue was irrigated. Hemostasis was confirmed. The subcutaneous tissue was closed using interrupted sutures. The skin was reapproximated using 3-0 Monocryl. The subumbilical fascia was closed using 0 Vicryl. The skin and the subumbilical area was closed using 3-0 Monocryl. The ports were closed using 3-0 Monocryl. 0 Vicryl was the suture material used except where otherwise mentioned.  Sponge, needle, and instrument counts were correct on 2 occasions. The estimated blood loss by the anesthesia team was 600 cc. My estimated blood loss was 300 cc. The patient tolerated her procedure well. She was awakened from her anesthetic without difficulty. She was transported to the recovery room in stable condition. The uterus, cervix, and both fallopian tubes were sent to pathology.  She was noted to drain  clear yellow urine at the end of the procedure.  Leonard Schwartz, M.D.

## 2011-10-17 NOTE — Progress Notes (Signed)
Patient adm from PACU via stretcher. Drowsy but oriented, VSS, O2 2L Michele Bean, Dilaudid PCA initiated, pt rates pain at 6 of 10. Abdominal dressing C/D/I. Scant vaginal drng. Denies N/V. Lurline Idol [facility]

## 2011-10-17 NOTE — H&P (Signed)
The patient was interviewed and examined today.  The previously documented history and physical examination was reviewed. There are no changes. The patient was offered a robotic hysterectomy by another surgeon. She declined. The operative procedure was reviewed. The risks and benefits were outlined again. The specific risks include, but are not limited to, anesthetic complications, bleeding, infections, and possible damage to the surrounding organs. The patient's questions were answered.  We are ready to proceed as outlined. The likelihood of the patient achieving the goals of this procedure is very likely.   Leonard Schwartz, M.D.

## 2011-10-17 NOTE — Transfer of Care (Signed)
Immediate Anesthesia Transfer of Care Note  Patient: Michele Bean  Procedure(s) Performed: Procedure(s) (LRB): LAPAROSCOPIC ASSISTED VAGINAL HYSTERECTOMY (N/A) HYSTERECTOMY ABDOMINAL (N/A)  Patient Location: PACU  Anesthesia Type: General  Level of Consciousness: sedated  Airway & Oxygen Therapy: Patient Spontanous Breathing and Patient connected to nasal cannula oxygen  Post-op Assessment: Report given to PACU RN  Post vital signs: Reviewed and stable  Complications: No apparent anesthesia complications

## 2011-10-17 NOTE — Anesthesia Postprocedure Evaluation (Signed)
  Anesthesia Post-op Note  Patient: Michele Bean  Procedure(s) Performed: Procedure(s) (LRB): LAPAROSCOPIC ASSISTED VAGINAL HYSTERECTOMY (N/A) HYSTERECTOMY ABDOMINAL (N/A)  Patient Location: PACU  Anesthesia Type: General  Level of Consciousness: awake, oriented and sedated  Airway and Oxygen Therapy: Patient Spontanous Breathing  Post-op Pain: none  Post-op Assessment: Post-op Vital signs reviewed, Patient's Cardiovascular Status Stable, Respiratory Function Stable, Patent Airway, No signs of Nausea or vomiting and Pain level controlled  Post-op Vital Signs: Reviewed and stable  Complications: No apparent anesthesia complications

## 2011-10-17 NOTE — Anesthesia Preprocedure Evaluation (Signed)
Anesthesia Evaluation  Patient identified by MRN, date of birth, ID band Patient awake    Reviewed: Allergy & Precautions, H&P , NPO status , Patient's Chart, lab work & pertinent test results  Airway Mallampati: III TM Distance: >3 FB Neck ROM: Full    Dental No notable dental hx. (+) Teeth Intact   Pulmonary neg pulmonary ROS,  breath sounds clear to auscultation  Pulmonary exam normal       Cardiovascular negative cardio ROS  Rhythm:Regular Rate:Normal     Neuro/Psych negative psych ROS   GI/Hepatic Neg liver ROS, GERD-  Medicated and Controlled,  Endo/Other  Hypothyroidism   Renal/GU negative Renal ROS  negative genitourinary   Musculoskeletal   Abdominal (+) + obese,   Peds  Hematology  (+) Blood dyscrasia, anemia ,   Anesthesia Other Findings   Reproductive/Obstetrics negative OB ROS                           Anesthesia Physical Anesthesia Plan  ASA: III  Anesthesia Plan: General   Post-op Pain Management:    Induction: Intravenous  Airway Management Planned: Oral ETT  Additional Equipment:   Intra-op Plan:   Post-operative Plan: Extubation in OR  Informed Consent: I have reviewed the patients History and Physical, chart, labs and discussed the procedure including the risks, benefits and alternatives for the proposed anesthesia with the patient or authorized representative who has indicated his/her understanding and acceptance.   Dental advisory given  Plan Discussed with: CRNA, Anesthesiologist and Surgeon  Anesthesia Plan Comments:         Anesthesia Quick Evaluation

## 2011-10-17 NOTE — Progress Notes (Signed)
Day of Surgery Procedure(s) (LRB): LAPAROSCOPIC ASSISTED VAGINAL HYSTERECTOMY (N/A) HYSTERECTOMY ABDOMINAL (N/A)  Subjective: Patient reports incisional pain.    Objective: I have reviewed patient's vital signs and intake and output.  General: alert and cooperative Resp: clear to auscultation bilaterally Cardio: regular rate and rhythm, S1, S2 normal, no murmur, click, rub or gallop GI: soft, non-tender; bowel sounds normal; no masses,  no organomegaly and incision: Dressing clean and dry  Assessment: s/p Procedure(s): LAPAROSCOPIC ASSISTED VAGINAL HYSTERECTOMY HYSTERECTOMY ABDOMINAL: stable  Plan: Routine care  LOS: 0 days    Gokul Waybright V 10/17/2011, 8:18 PM

## 2011-10-17 NOTE — H&P (Signed)
Michele Bean is an 46 y.o. female. 0 P 0-0-0-0.  She has been followed at the [facility] of [facility]. She has a known history of fibroids.  An ultrasound showed a 12.1 x 8.9 cm uterus.  Multiple fibroids were noted with the largest fibroid measuring 5.1 cm.  The patient complains of back and pelvic discomfort.  An endometrial biopsy was benign.  Gonorrhea and Chlamydia cultures are negative.  Her most recent Pap smear is within normal limits.  The patient had a myomectomy in 2000.  An 11 cm fibroid was removed.     Past Medical History  Diagnosis Date  . Hx of menorrhagia   . Fibroid   . Thyroid dysfunction   . Yeast vaginitis 2010  . Anemia   . H/O low back pain   . Abnormal Pap smear   . Seasonal allergies   . Hypothyroidism   . Lower back pain     tx with naprosyn  . GERD (gastroesophageal reflux disease)   . Headache     OTC meds PRN    Past Surgical History  Procedure Date  . Myomectomy 2000  . Wisdom tooth extraction   . Nasal sinus surgery 2006, 2007    x 2  . Arthroscopic knee surgery     left  . Foot surgery 1999    bilateral - hammer toes    Family History  Problem Relation Age of Onset  . Obesity Father     Social History:  reports that she has never smoked. She has never used smokeless tobacco. She reports that she does not drink alcohol or use illicit drugs.  Allergies: No Known Allergies  Medications: Synthroid, Allegra, vitamins, iron  Review of systems: See history of present illness  Blood pressure 126/78, resp. rate 16, height 5' 4" (1.626 m), weight 245 lb (111.131 kg), last menstrual period 09/25/2011. General: alert and cooperative Resp: clear to auscultation bilaterally Cardio: regular rate and rhythm, S1, S2 normal, no murmur, click, rub or gallop GI: soft, non-tender; bowel sounds normal; no masses,  no organomegaly Extremities: extremities normal, atraumatic, no cyanosis or edema neurologic  exam: Grossly normal  External genitalia: normal general appearance Vaginal: normal mucosa without prolapse or lesions Cervix: normal appearance Adnexa: normal bimanual exam Uterus: 12 week size and irregular, poor descent  CBC    Component Value Date/Time   WBC 7.2 10/08/2011 1102   RBC 5.68* 10/08/2011 1102   HGB 10.3* 10/08/2011 1102   HCT 33.2* 10/08/2011 1102   PLT 252 10/08/2011 1102   MCV 58.5* 10/08/2011 1102   MCH 18.1* 10/08/2011 1102   MCHC 31.0 10/08/2011 1102   RDW 18.5* 10/08/2011 1102    Assessment/Plan: Fibroids Anemia Pelvic pressure Back pain Obesity Hypothyroidism  We have planned a laparoscopy assisted vaginal hysterectomy.  The patient understands that we may need to perform a total abdominal hysterectomy.  The patient understands the indications for her surgical procedure.  She accepts the risk of, but not limited to, anesthetic complications, bleeding, infections, and possible damage to surrounding organs.  Yuji Walth V 10/16/2011, 9:32 AM     not limited to, anesthetic complications, bleeding, infections, and possible damage to surrounding organs.  Kainat Pizana V  10/16/2011, 9:32 AM

## 2011-10-18 LAB — CBC
Hemoglobin: 9.5 g/dL — ABNORMAL LOW (ref 12.0–15.0)
MCH: 17.8 pg — ABNORMAL LOW (ref 26.0–34.0)
Platelets: 276 10*3/uL (ref 150–400)
RBC: 5.34 MIL/uL — ABNORMAL HIGH (ref 3.87–5.11)
WBC: 17.1 10*3/uL — ABNORMAL HIGH (ref 4.0–10.5)

## 2011-10-18 MED ORDER — OXYCODONE-ACETAMINOPHEN 5-325 MG PO TABS: 1.0000 | ORAL_TABLET | ORAL | Status: AC | PRN

## 2011-10-18 MED ORDER — NAPROXEN 500 MG PO TABS: ORAL_TABLET | ORAL | Status: DC

## 2011-10-18 MED ORDER — ONDANSETRON HCL 4 MG PO TABS: 4.0000 mg | ORAL_TABLET | Freq: Four times a day (QID) | ORAL | Status: AC | PRN

## 2011-10-18 NOTE — Progress Notes (Signed)
Michele Bean is a67 y.o.  [id number]  Post Op Date # 1  Subjective: Patient is Doing well postoperatively. Patient has Pain is controlled with current analgesics. Medications being used: PCA., Had episode of nausea, vomiting and dizziness last night while ambulating in hallway but none since.  Has only had liquids to include broth.  Has voided but has yet to pass flatus.  Objective: Vital signs in last 24 hours: Temp:  [97.4 F (36.3 C)-99 F (37.2 C)] 98.2 F (36.8 C) (08/09 0530) Pulse Rate:  [69-82] 81  (08/09 0530) Resp:  [15-21] 16  (08/09 0530) BP: (115-153)/(58-92) 153/92 mmHg (08/09 0530) SpO2:  [95 %-100 %] 100 % (08/09 0530) Weight:  [236 lb (107.049 kg)-242 lb (109.77 kg)] 242 lb (109.77 kg) (08/09 0500)  Intake/Output from previous day: 08/08 0701 - 08/09 0700 In: 5116.7 [P.O.:400; I.V.:4716.7] Out: 2925 [Urine:2300] Intake/Output this shift: Total I/O In: 1816.7 [P.O.:400; I.V.:1416.7] Out: 1775 [Urine:1750; Emesis/NG output:25]  Lab 10/18/11 0550  WBC 17.1*  HGB 9.5*  HCT 31.3*  PLT 276    No results found for this basename: NA:3,K:3,CL:3,CO2:3,BUN:3,CREATININE:3,CALCIUM:3,LABALBU:3,PROT:3,BILITOT:3,ALKPHOS:3,ALT:3,AST:3,GLUCOSE:3 in the last 168 hours  EXAM: General: alert, oriented in no acute distress Lungs: clear to auscultation Heart:  regular rate and rhythm Abdomen: soft, appropriately tender without guarding;  dressings clean, dry and intact Extremities: negative Homan's or calf tenderness Perineal pad: small dried blood   Assessment: s/p Procedure(s): LAPAROSCOPIC ASSISTED VAGINAL HYSTERECTOMY HYSTERECTOMY ABDOMINAL: stable, progressing well and anemia  Plan: Advance diet Encourage ambulation Advance to PO medication Discontinue IV fluids Discharge home tomorrow.  LOS: 1 day    Bean,ELMIRA, PA-C 10/18/2011 6:35 AM  The patient was interviewed and examined this morning. I agree with the above.  Michele Bean.D.

## 2011-10-18 NOTE — Discharge Summary (Addendum)
Physician Discharge Summary  Patient ID: Michele Bean MRN: [id number] DOB/AGE: 03/21/1965 46 y.o.  Admit date: 10/17/2011 Discharge date: 10/19/2011  Admission diagnoses:  Fibroids Dysmenorrhea Anemia (hemoglobin equals 10.3) Obesity  Discharge Diagnoses:  Symptomatic Uterine Fibroids                                          Dysmenorrhea                                         Pelvic Adhesions                                         Anemia                                         Obesity Active Problems:  * No active hospital problems. *    Operation:  Laparoscopy with laparoscopic lysis of adhesions, total abdominal hysterectomy with bilateral salpingectomy.   Discharged Condition: Stable  Hospital Course:  On the date of admission the patient underwent aforementioned procedures, tolerating them well.  Operative findings included dense adhesions between the bowel, the anterior uterus, and the anterior peritoneum. The uterus was abroad. There was poor mobility. A total abdominal hysterectomy was required.  Post-operative course was unremarkable with patient resuming bowel and bladder function by post-operative day # 1 she was deemed ready for discharge home on postoperative day #2.  CBC    Component Value Date/Time   WBC 17.1* 10/18/2011 0550   RBC 5.34* 10/18/2011 0550   HGB 9.5* 10/18/2011 0550   HCT 31.3* 10/18/2011 0550   PLT 276 10/18/2011 0550   MCV 58.6* 10/18/2011 0550   MCH 17.8* 10/18/2011 0550   MCHC 30.4 10/18/2011 0550   RDW 18.1* 10/18/2011 0550     Disposition:  Home to self care  Discharge Medications:   [name], [name]  Home Medication Instructions [id number]   Printed on:10/18/11 0659  Medication Information                    fexofenadine (ALLEGRA) 60 MG tablet Take by mouth daily.           Multiple Vitamin (MULTIVITAMIN) tablet Take 1 tablet by mouth daily.           levothyroxine (SYNTHROID, LEVOTHROID) 50 MCG tablet Take 50 mcg by mouth  daily.           ranitidine (ZANTAC) 300 MG tablet Take 300 mg by mouth daily.           ondansetron (ZOFRAN) 4 MG tablet Take 1 tablet (4 mg total) by mouth every 6 (six) hours as needed for nausea.           oxyCODONE-acetaminophen (PERCOCET/ROXICET) 5-325 MG per tablet Take 1-2 tablets by mouth every 4 (four) hours as needed for pain (moderate to severe pain (when tolerating fluids)).            [name], [name]  Home Medication Instructions [id number]   Printed on:10/19/11 1221  Medication Information  fexofenadine (ALLEGRA) 60 MG tablet Take by mouth daily.           Multiple Vitamin (MULTIVITAMIN) tablet Take 1 tablet by mouth daily.           levothyroxine (SYNTHROID, LEVOTHROID) 50 MCG tablet Take 50 mcg by mouth daily.           ranitidine (ZANTAC) 300 MG tablet Take 300 mg by mouth daily.           ondansetron (ZOFRAN) 4 MG tablet Take 1 tablet (4 mg total) by mouth every 6 (six) hours as needed for nausea.           oxyCODONE-acetaminophen (PERCOCET/ROXICET) 5-325 MG per tablet Take 1-2 tablets by mouth every 4 (four) hours as needed for pain (moderate to severe pain (when tolerating fluids)).           naproxen (NAPROSYN) 500 MG tablet 1 po pc bid x 5 days then prn           ferrous sulfate (FEOSOL) 325 (65 FE) MG tablet Take 1 tablet (325 mg total) by mouth daily with breakfast.                                   Follow-up:  Dr. Stefano Gaul in 6 weeks    Signed: Hal Morales, PA-C 10/19/2011, 12:19 PM

## 2011-10-18 NOTE — Addendum Note (Signed)
Addendum  created 10/18/11 1027 by Graciela Husbands, CRNA   Modules edited:Notes Section

## 2011-10-18 NOTE — Anesthesia Postprocedure Evaluation (Signed)
  Anesthesia Post-op Note  Patient: Michele Bean  Procedure(s) Performed: Procedure(s) (LRB): LAPAROSCOPIC ASSISTED VAGINAL HYSTERECTOMY (N/A) HYSTERECTOMY ABDOMINAL (N/A)  Patient Location: 308  Anesthesia Type: General  Level of Consciousness: awake, alert  and oriented  Airway and Oxygen Therapy: Patient Spontanous Breathing and Patient connected to nasal cannula oxygen  Post-op Pain: mild  Post-op Assessment: Post-op Vital signs reviewed and Patient's Cardiovascular Status Stable  Post-op Vital Signs: Reviewed and stable  Complications: No apparent anesthesia complications

## 2011-10-19 ENCOUNTER — Encounter (HOSPITAL_COMMUNITY): Payer: Self-pay | Admitting: *Deleted

## 2011-10-19 MED ORDER — FERROUS SULFATE 325 (65 FE) MG PO TABS: 325.0000 mg | ORAL_TABLET | Freq: Every day | ORAL | Status: DC

## 2011-10-19 NOTE — Progress Notes (Signed)
D/C instructions w/teachback & prescription given to pt - all questions answered. D/C home w/mother

## 2011-10-19 NOTE — Progress Notes (Signed)
2 Days Post-Op Procedure(s) (LRB): LAPAROSCOPy HYSTERECTOMY ABDOMINAL   Subjective: Patient reports no nausea, vomiting, incisional pain, is tolerating PO, + flatus, + BM and no problems voiding.    Objective: I have reviewed patient's vital signs, intake and output, medications and labs.  General: alert, cooperative and no distress Resp: clear to auscultation bilaterally Cardio: regular rate and rhythm, S1, S2 normal, no murmur, click, rub or gallop GI: soft, non-tender; bowel sounds normal; no masses,  no organomegaly and incision: clean, dry, intact and no drainage Extremities: extremities normal, atraumatic, no cyanosis or edema and Homans sign is negative, no sign of DVT Vaginal Bleeding: none  Assessment: s/p Procedure(s): LAPAROSCOPy HYSTERECTOMY ABDOMINAL: progressing well, tolerating diet and adequate pain relief Asymptomatic post op anemia  Plan: Discharge home  LOS: 2 days    Michele Bean P 10/19/2011, 12:02 PM

## 2011-10-21 LAB — TYPE AND SCREEN: Unit division: 0

## 2011-10-21 NOTE — Progress Notes (Signed)
Post ur chart review completed. 

## 2011-10-22 ENCOUNTER — Encounter: Payer: Commercial Managed Care - PPO | Admitting: Obstetrics and Gynecology

## 2011-10-22 ENCOUNTER — Other Ambulatory Visit: Payer: Commercial Managed Care - PPO

## 2011-10-31 ENCOUNTER — Encounter: Payer: Self-pay | Admitting: Obstetrics and Gynecology

## 2011-10-31 ENCOUNTER — Ambulatory Visit (INDEPENDENT_AMBULATORY_CARE_PROVIDER_SITE_OTHER): Payer: Commercial Managed Care - PPO | Admitting: Obstetrics and Gynecology

## 2011-10-31 VITALS — BP 120/80 | HR 86 | Temp 97.9°F | Resp 14 | Ht 64.0 in | Wt 238.0 lb

## 2011-10-31 DIAGNOSIS — D219 Benign neoplasm of connective and other soft tissue, unspecified: Secondary | ICD-10-CM

## 2011-10-31 DIAGNOSIS — D259 Leiomyoma of uterus, unspecified: Secondary | ICD-10-CM

## 2011-10-31 NOTE — Progress Notes (Signed)
DATE OF SURGERY: 10/17/2011 TYPE OF SURGERY:LAVH  PAIN:No VAG BLEEDING: no VAG DISCHARGE: no NORMAL GI FUNCTN: yes NORMAL GU FUNCTN: yes  HISTORY OF PRESENT ILLNESS  [name] is a 46 y.o. year old female,G0P0, who presents for a problem visit. The patient had a total bowel hysterectomy on October 17, 2011.  Pathology reported benign.  Subjective:  Doing well.  Was to return to work.  Objective:  BP 120/80  Pulse 86  Temp 97.9 F (36.6 C) (Oral)  Resp 14  Ht 5\' 4"  (1.626 m)  Wt 238 lb (107.956 kg)  BMI 40.85 kg/m2  LMP 09/25/2011   General: alert Resp: clear to auscultation bilaterally Cardio: regular rate and rhythm, S1, S2 normal, no murmur, click, rub or gallop GI: incision: clean, dry, intact and well healed  External genitalia: normal general appearance Vaginal: healing cuff Adnexa: normal bimanual exam  Assessment:  Doing well status post abdominal hysterectomy  Plan:  Okay to return to work.  Return to office in 09/2012.   Leonard Schwartz M.D.  10/31/2011 8:54 AM

## 2012-01-01 ENCOUNTER — Other Ambulatory Visit: Payer: Self-pay | Admitting: Neurosurgery

## 2012-01-01 DIAGNOSIS — M47817 Spondylosis without myelopathy or radiculopathy, lumbosacral region: Secondary | ICD-10-CM

## 2012-01-05 ENCOUNTER — Ambulatory Visit
Admission: RE | Admit: 2012-01-05 | Discharge: 2012-01-05 | Disposition: A | Discharge status: Home / Self Care | Payer: Commercial Managed Care - PPO | Source: Ambulatory Visit | Attending: Neurosurgery | Admitting: Neurosurgery

## 2012-01-05 DIAGNOSIS — M47817 Spondylosis without myelopathy or radiculopathy, lumbosacral region: Secondary | ICD-10-CM

## 2012-05-06 DIAGNOSIS — D239 Other benign neoplasm of skin, unspecified: Secondary | ICD-10-CM | POA: Insufficient documentation

## 2012-07-20 DIAGNOSIS — L299 Pruritus, unspecified: Secondary | ICD-10-CM | POA: Insufficient documentation

## 2012-07-28 ENCOUNTER — Other Ambulatory Visit (HOSPITAL_COMMUNITY): Payer: Self-pay | Admitting: Urology

## 2012-07-28 DIAGNOSIS — N281 Cyst of kidney, acquired: Secondary | ICD-10-CM

## 2012-07-30 ENCOUNTER — Encounter (HOSPITAL_COMMUNITY): Payer: Self-pay

## 2012-07-30 ENCOUNTER — Ambulatory Visit (HOSPITAL_COMMUNITY)
Admission: RE | Admit: 2012-07-30 | Discharge: 2012-07-30 | Disposition: A | Discharge status: Home / Self Care | Payer: Commercial Managed Care - PPO | Source: Ambulatory Visit | Attending: Urology | Admitting: Urology

## 2012-07-30 DIAGNOSIS — Q619 Cystic kidney disease, unspecified: Secondary | ICD-10-CM | POA: Insufficient documentation

## 2012-07-30 DIAGNOSIS — N281 Cyst of kidney, acquired: Secondary | ICD-10-CM

## 2012-07-30 DIAGNOSIS — Z90710 Acquired absence of both cervix and uterus: Secondary | ICD-10-CM | POA: Insufficient documentation

## 2012-07-30 MED ORDER — IOHEXOL 350 MG/ML SOLN
125.0000 mL | Freq: Once | INTRAVENOUS | Status: AC | PRN
  Administered 2012-07-30: 125 mL via INTRAVENOUS

## 2012-08-25 ENCOUNTER — Other Ambulatory Visit: Payer: Self-pay

## 2012-08-25 DIAGNOSIS — Z1231 Encounter for screening mammogram for malignant neoplasm of breast: Secondary | ICD-10-CM

## 2012-11-02 ENCOUNTER — Ambulatory Visit: Payer: Commercial Managed Care - PPO

## 2012-11-11 ENCOUNTER — Ambulatory Visit
Admission: RE | Admit: 2012-11-11 | Discharge: 2012-11-11 | Disposition: A | Discharge status: Home / Self Care | Payer: Commercial Managed Care - PPO | Source: Ambulatory Visit

## 2012-11-11 DIAGNOSIS — Z1231 Encounter for screening mammogram for malignant neoplasm of breast: Secondary | ICD-10-CM

## 2012-12-03 ENCOUNTER — Other Ambulatory Visit: Payer: Self-pay | Admitting: Gastroenterology

## 2012-12-03 DIAGNOSIS — R1011 Right upper quadrant pain: Secondary | ICD-10-CM

## 2012-12-10 ENCOUNTER — Ambulatory Visit
Admission: RE | Admit: 2012-12-10 | Discharge: 2012-12-10 | Disposition: A | Discharge status: Home / Self Care | Payer: Commercial Managed Care - PPO | Source: Ambulatory Visit | Attending: Gastroenterology | Admitting: Gastroenterology

## 2012-12-10 DIAGNOSIS — R1011 Right upper quadrant pain: Secondary | ICD-10-CM

## 2013-01-21 ENCOUNTER — Telehealth: Payer: Self-pay | Admitting: *Deleted

## 2013-01-21 NOTE — Telephone Encounter (Signed)
Pt asked for the m=name of the cream Dr. Leeanne Deed had suggested she use on her rough toenail beds.  I checked pt's Mis[facility] chart 45409, Dr Leeanne Deed had suggested a Urea based cream.  I informed pt he had suggested a urea-based cream and offer the name AmLactin Cream or Lotion.  Pt thanked me.

## 2013-02-11 DIAGNOSIS — J343 Hypertrophy of nasal turbinates: Secondary | ICD-10-CM | POA: Insufficient documentation

## 2013-02-11 DIAGNOSIS — J309 Allergic rhinitis, unspecified: Secondary | ICD-10-CM | POA: Insufficient documentation

## 2013-09-06 DIAGNOSIS — L669 Cicatricial alopecia, unspecified: Secondary | ICD-10-CM | POA: Insufficient documentation

## 2013-09-06 DIAGNOSIS — L089 Local infection of the skin and subcutaneous tissue, unspecified: Secondary | ICD-10-CM | POA: Insufficient documentation

## 2013-10-14 ENCOUNTER — Other Ambulatory Visit: Payer: Self-pay

## 2013-10-14 DIAGNOSIS — Z1231 Encounter for screening mammogram for malignant neoplasm of breast: Secondary | ICD-10-CM

## 2013-11-12 ENCOUNTER — Ambulatory Visit: Payer: Commercial Managed Care - PPO

## 2013-11-17 ENCOUNTER — Ambulatory Visit
Admission: RE | Admit: 2013-11-17 | Discharge: 2013-11-17 | Disposition: A | Discharge status: Home / Self Care | Payer: Commercial Managed Care - PPO | Source: Ambulatory Visit

## 2013-11-17 DIAGNOSIS — Z1231 Encounter for screening mammogram for malignant neoplasm of breast: Secondary | ICD-10-CM

## 2014-03-02 ENCOUNTER — Ambulatory Visit (INDEPENDENT_AMBULATORY_CARE_PROVIDER_SITE_OTHER): Payer: Commercial Managed Care - PPO | Admitting: Podiatry

## 2014-03-02 ENCOUNTER — Encounter: Payer: Self-pay | Admitting: Podiatry

## 2014-03-02 VITALS — BP 130/84 | HR 72 | Resp 12

## 2014-03-02 DIAGNOSIS — L600 Ingrowing nail: Secondary | ICD-10-CM

## 2014-03-02 NOTE — Progress Notes (Signed)
   Subjective:    Patient ID: Michele Bean, female    DOB: 20-Aug-1965, 48 y.o.   MRN: [id number]  HPI  N-SORE L-LT FOOT GREAT TOENAIL D-1 YEAR O-SLOWLY C-WORSE A-PRESSURE T-NONE  This patient presents again complaining of painful left hallux toenail. The left hallux nail has undergone multiple phenol matricectomy's with reoccurrence. The initial treatment for this problem was 09/08/1998 with subsequent revisional phenol matricectomy's on 07/26/2005, 03/02/2012, 09/09/2012. After each revisional phenol matricectomy patient has had temporary relief.  History of hammertoe surgery bilaterally without any difficulty postoperatively  Review of Systems  Cardiovascular: Positive for leg swelling.  All other systems reviewed and are negative.      Objective:   Physical Exam  Orientated 3  Vascular: DP and PT pulses 2/4 bilaterally Capillary reflex immediate bilaterally  Neurological: Deferred  Dermatological: Well-healed surgical scars second fourth toes bilaterally There is a small spicule in the posterior medial nail fold area the left hallux nail versus hyperkeratotic tissue. The remaining left hallux nail bed demonstrates no nail regrowth. There is no erythema, edema, drainage in the left hallux nail bed. There is palpable tenderness in the posterior nail fold area on the left hallux nail which duplicates her discomfort  Musculoskeletal: Pes planus bilaterally       Assessment & Plan:   Assessment: Satisfactory vascular status Partial regrowth or hyperkeratotic tissue posterior medial nail fold area left hallux  Plan: I had a detailed discussion with patient today making her  aware that she had multiple procedures with continuous partial recurrence and discomfort in the left hallux nail bed. Because she is quite uncomfortable I offered again revisional phenol matricectomy without any reassurance of the results. Patient understands that recurrence is a possibility  verbally consents to procedure.  The left hallux was blocked with 3 mL 50-50 mixture of 2% plain Xylocaine and 0.5% plain Marcaine.The left hallux is painted with Betadine and exsanguinated. The posterior medial nail fold area was probed and a small hyperkeratotic tissue with a small nail spicul were removed. There is no drainage or edema noted in the area. Three pplications of phenol were applied to the posterior medial nail fold area for 1 minute per application. The area was rinsed with alcohol and an antibiotic compression dressing was applied. The tourniquet was released and spontaneous capillary filling times noted in the left hallux. Patient tolerated procedure without any difficulty.  Postoperative oral reconstruction provided. Over-the-counter NSAID recommended for pain control if needed.  Reappoint at patient's request

## 2014-03-02 NOTE — Patient Instructions (Signed)

## 2014-03-03 ENCOUNTER — Encounter: Payer: Self-pay | Admitting: Podiatry

## 2014-03-09 ENCOUNTER — Ambulatory Visit: Payer: Self-pay | Admitting: Podiatry

## 2014-04-18 ENCOUNTER — Ambulatory Visit (INDEPENDENT_AMBULATORY_CARE_PROVIDER_SITE_OTHER): Payer: Commercial Managed Care - PPO | Admitting: Podiatry

## 2014-04-18 ENCOUNTER — Ambulatory Visit (INDEPENDENT_AMBULATORY_CARE_PROVIDER_SITE_OTHER): Payer: Commercial Managed Care - PPO

## 2014-04-18 ENCOUNTER — Encounter: Payer: Self-pay | Admitting: Podiatry

## 2014-04-18 VITALS — BP 135/67 | HR 61 | Resp 12 | Ht 64.0 in | Wt 243.0 lb

## 2014-04-18 DIAGNOSIS — S93601A Unspecified sprain of right foot, initial encounter: Secondary | ICD-10-CM

## 2014-04-18 DIAGNOSIS — M79671 Pain in right foot: Secondary | ICD-10-CM

## 2014-04-18 NOTE — Patient Instructions (Signed)
Where your existing boot on the right foot/ankle 6 weeks except when showering, driving and sleeping

## 2014-04-18 NOTE — Progress Notes (Signed)
   Subjective:    Patient ID: Michele Bean, female    DOB: 1966-03-02, 49 y.o.   MRN: [id number]  HPI Comments: N foot pain L right forefoot 2,3,4 metatarsal dorsal D 2 months O off and on C swelling and throbbing to stabbing A no known stimuli T none  Pt complains of a painful hard core at the left plantar 5th metatarsal proximal head, that Dr. Lucile Crater.  Foot Pain   denies any direct injury or change in activity  patient has a relatively sedentary occupation    Review of Systems  All other systems reviewed and are negative.      Objective:   Physical Exam  Orientated 3  Vascular: DP pulses 2/4 bilaterally PT pulses 2/4 bilaterally Capillary reflex reactive bilaterally  Neurological: Ankle reflex equal and reactive bilaterally Sensation to 10 g monofilament wire intact 5/5 bilaterally  Dermatological: Texture and turgor within normal limits Porokeratosis plantar lateral left foot  Musculoskeletal: Pes planus bilaterally Palpable tenderness dorsal shafts of second-fourth metatarsals right without any palpable lesions. This seems to duplicate the pain in the right forefoot area   X-ray examination weightbearing right foot  Intact bony structure without fracture and/or dislocation Pes planus Metatarsus adductus Taylor's bunion  Radiographic impression: No acute bony abnormality noted in the right foot      Assessment & Plan:   Assessment: Bone stress reaction 2-4 metatarsals right foot Sprain right foot Porokeratosis 1 left  Plan: Patient has existing Cam Walker like boot in good state repair from previous history of foot ankle pain. Patient will wear this Cam Walker like boot on the right foot 6 weeks on an ongoing continuous basis Debridement of plantar keratoses left  Reappoint if symptoms the right foot are not improving after 6 weeks of immobilization

## 2014-05-04 ENCOUNTER — Telehealth: Payer: Self-pay | Admitting: *Deleted

## 2014-05-04 MED ORDER — TRAMADOL HCL 50 MG PO TABS: 50.0000 mg | ORAL_TABLET | Freq: Two times a day (BID) | ORAL | Status: DC

## 2014-05-04 NOTE — Telephone Encounter (Addendum)
Pt complains of worsening pain in the right foot, and request pain medication.  Dr. Leigh Aurora orders were called to pt and she is to pick up Tramadol rx and make an appt in [place].  Pt agrees and will pick up the rx in the morning and make an appt at the same time.

## 2014-05-04 NOTE — Telephone Encounter (Signed)
Can do ultram 50mg  BID disp #30. Follow-up

## 2014-06-29 ENCOUNTER — Ambulatory Visit (INDEPENDENT_AMBULATORY_CARE_PROVIDER_SITE_OTHER): Payer: Commercial Managed Care - PPO | Admitting: Podiatry

## 2014-06-29 ENCOUNTER — Encounter: Payer: Self-pay | Admitting: Podiatry

## 2014-06-29 VITALS — BP 132/79 | HR 60 | Resp 12

## 2014-06-29 DIAGNOSIS — S93602A Unspecified sprain of left foot, initial encounter: Secondary | ICD-10-CM | POA: Diagnosis not present

## 2014-06-29 DIAGNOSIS — M79672 Pain in left foot: Secondary | ICD-10-CM | POA: Diagnosis not present

## 2014-06-29 LAB — URIC ACID: Uric Acid, Serum: 5.9 mg/dL (ref 2.4–7.0)

## 2014-06-29 LAB — RHEUMATOID FACTOR: Rhuematoid fact SerPl-aCnc: <10 IU/mL (ref <=14)

## 2014-06-29 LAB — C-REACTIVE PROTEIN: CRP: 0.9 mg/dL — AB (ref <0.60)

## 2014-06-29 NOTE — Patient Instructions (Signed)
Present to lab for arthritis screening blood test

## 2014-06-29 NOTE — Progress Notes (Signed)
   Subjective:    Patient ID: Michele Bean, female    DOB: 1965/07/26, 49 y.o.   MRN: [id number]  HPI  N-SORE, SWOLLEN L-LT FOOT D-2 WEEKS O-SUDDENLY C-WORSE A-WEARING SHOES T-SOAK WITH EPSON SALT.  There is no prior history direct injury to the left foot  Patient was evaluated on 04/18/2014 for a painful right foot with a provisional diagnosis of bone stress reaction right metatarsals sprain foot. The right foot pain seems to have resolved with wearing a Cam Walker boot that she had an relative reduction of activity.  Now more recently left foot has become uncomfortable  Review of Systems  Cardiovascular: Positive for leg swelling.       Objective:   Physical Exam  Orientated 3  Vascular: DP and PT pulses 2/4 bilaterally Capillary reflex immediate bilaterally No edema noted lower legs or ankles bilaterally  Neurological: Ankle reflex equal and reactive bilaterally Sensation to 10 g monofilament wire intact 5/5 bilaterally  Dermatological: Texture and turgor within normal limits Plantar keratoses left Well-healed surgical scars dorsal aspect second and third toes bilaterally  Musculoskeletal: Is no restriction ankle, subtalar, midtarsal joints bilaterally Mild palpable tenderness anterior ankles bilaterally without any palpable lesions        Assessment & Plan:   Assessment: Rule out inflammatory arthritis Sprain strain left foot/ankle  Plan:  Refer patient to lab for: Uric acid Rheumatoid factor C-reactive protein Antinuclear antibody  Reappoint 7 days review results of arthritis screening panel

## 2014-06-30 LAB — SEDIMENTATION RATE: Sed Rate: 5 mm/hr (ref 0–20)

## 2014-06-30 LAB — ANA: Anti Nuclear Antibody(ANA): NEGATIVE

## 2014-07-06 ENCOUNTER — Ambulatory Visit (INDEPENDENT_AMBULATORY_CARE_PROVIDER_SITE_OTHER): Payer: Commercial Managed Care - PPO | Admitting: Podiatry

## 2014-07-06 ENCOUNTER — Encounter: Payer: Self-pay | Admitting: Podiatry

## 2014-07-06 VITALS — BP 138/89 | HR 60 | Resp 12

## 2014-07-06 DIAGNOSIS — M722 Plantar fascial fibromatosis: Secondary | ICD-10-CM

## 2014-07-06 MED ORDER — DICLOFENAC SODIUM 75 MG PO TBEC: 75.0000 mg | DELAYED_RELEASE_TABLET | Freq: Two times a day (BID) | ORAL | Status: DC

## 2014-07-06 NOTE — Progress Notes (Signed)
   Subjective:    Patient ID: Michele Bean, female    DOB: [birth year], 49 y.o.   MRN: [id number]  HPI ''B/L FOOT STILL PAINFUL AND LT FOOT STILL SWOLLEN.''  Patient presents again with initial symptoms initially evaluated 04/18/2014 for a painful right foot which is initially diagnosis suspect sprain right foot or possible bone stress reaction. At that time a Cam Walker boot was was worn with some reduction the symptoms. Patient was complaining of pain and Dr. Earleen Newport described tramadol from poor patient on 05/04/2014. Patient returned on April 20 and was complaining now of left foot pain and swelling without direct injury. At that visit if possible inflammatory origin for her pain was suspected and a uric acid, rheumatoid factor, C-reactive protein an antinuclear antibody were ordered the results of these tests are available.  Now patient is complaining of bilateral foot pain and describes swelling as well the symptoms are primarily on weightbearing but are somewhat persistent even with rest she is denying any other history of bodily complaints   Review of Systems  Cardiovascular: Positive for leg swelling.  Musculoskeletal: Positive for joint swelling.       Objective:   Physical Exam  Orientated 3  Vascular: DP and PT pulses 2/4 bilaterally No calf edema or ankle edema noted bilaterally  There is no calf tenderness to palpation bilaterally  Neurological: Ankle reflexes equal and reactive bilaterally Knee reflexes equal and reactive bilaterally Vibratory sensation intact bilaterally Sensation to 10 g monofilament wire intact 5/5 bilaterally  Dermatological: Texture and turgor within normal limits bilaterally No skin lesions noted bilaterally  Musculoskeletal: There is tenderness on inversion and eversion are right midfoot without crepitus There is palpable tenderness in the mid fascial band right without any palpable lesions There is mild tenderness on plantar flexion  of metatarsals 1 through 5 right without crepitus or restriction of motion  The left foot is not tender on range of motion of the ankle subtalar midtarsal joint MPJs There is mild sensitivity to dorsal pressure on the dorsal aspect of left foot without any palpable lesions  Lab results collected 06/29/2014: Anti - nuclear antibody negative C reactive protein: Slightly elevated at 0.9 (normal less than 0.60) Rheumatoid factor less than 10 within normal limits Sedimentation rate:  5 Within normal limits      Assessment & Plan:   Assessment: There is no evidence of DVT lower extremity bilaterally No evidence of edema bilaterally Migratory foot pain may be suggestive of possible inflammatory condition Plantar fasciitis right  Plan: At this time I review the results of the lab of 06/29/2014 making patient aware that one nonspecific test was mildly elevated and the rest of tests were within normal limits.  At this time I am going to Rx an NSAID for 30 days to see if her symptoms reduce and then reevaluate Rx diclofenac sodium 75 mg by mouth twice a day 30 days  Reappoint 30 days

## 2014-07-06 NOTE — Patient Instructions (Signed)
I prescribed diclofenac sodium 75 mg by mouth twice a day 30 days Take medication with food and do not take late at night before sleeping Wear sturdy walking and or running shoes

## 2014-08-02 ENCOUNTER — Telehealth: Payer: Self-pay | Admitting: *Deleted

## 2014-08-02 DIAGNOSIS — M722 Plantar fascial fibromatosis: Secondary | ICD-10-CM

## 2014-08-02 NOTE — Telephone Encounter (Signed)
Pt states the Diclofenac is not helping, the foot is still very painful.  Pt states unfortunately she is having to cancel her next appt.  Please advise.

## 2014-08-03 ENCOUNTER — Ambulatory Visit: Payer: Commercial Managed Care - PPO | Admitting: Podiatry

## 2014-08-03 NOTE — Telephone Encounter (Signed)
I called and informed patient that Dr. Amalia Hailey wants to know if the pain is primarily in the right foot.  "It's been switching back and forth from foot to foot.  It's flared back up in my right foot.  I'm wondering if I may have Gout.  I know he said my levels were normal but I'm wondering if I have Gout.  I looked at my lab results, my Uric Acid was close to abnormal.  My body is different certain things may not show up immediately.  Will he consider putting me on Gout medication?"  I will have to ask him.  He will not be in the office anymore this week.  He wants to schedule a MRI and he said he can give you a refill of Tramadol.  "Okay, I'll get the MRI done but Tramadol doesn't help me at all.  I still have some of it left."  Okay, I'll put the order in for the MRI.  You should hear from someone to get it scheduled.

## 2014-08-22 ENCOUNTER — Telehealth: Payer: Self-pay | Admitting: *Deleted

## 2014-08-22 DIAGNOSIS — M1990 Unspecified osteoarthritis, unspecified site: Secondary | ICD-10-CM

## 2014-08-22 NOTE — Telephone Encounter (Addendum)
Pt states Dr. Amalia Hailey has ordered MRI of the right foot, but actually both feet hurt and she would like an MRI of both feet.  Dr. Amalia Hailey states order MRI of Both feet.

## 2014-08-25 ENCOUNTER — Telehealth: Payer: Self-pay | Admitting: *Deleted

## 2014-08-25 NOTE — Telephone Encounter (Signed)
Entered in error

## 2014-08-29 ENCOUNTER — Ambulatory Visit
Admission: RE | Admit: 2014-08-29 | Discharge: 2014-08-29 | Disposition: A | Discharge status: Home / Self Care | Payer: Commercial Managed Care - PPO | Source: Ambulatory Visit | Attending: Podiatry | Admitting: Podiatry

## 2014-08-29 DIAGNOSIS — M722 Plantar fascial fibromatosis: Secondary | ICD-10-CM

## 2014-09-01 ENCOUNTER — Telehealth: Payer: Self-pay | Admitting: *Deleted

## 2014-09-01 NOTE — Telephone Encounter (Signed)
Dr. Amalia Hailey states have pt come in to discuss MRI results.

## 2014-09-06 ENCOUNTER — Telehealth: Payer: Self-pay | Admitting: *Deleted

## 2014-09-06 NOTE — Telephone Encounter (Addendum)
-----   Message from Lolita Rieger sent at 09/01/2014 10:50 AM  ----- Message -----    From: Gean Birchwood, DPM    Sent: 08/31/2014   7:44 AM      To: Lolita Rieger  Contact patient to review the results of MRI right foot dated 08/29/2014.  Pt is scheduled or 09/14/2014 to discuss MRI results.

## 2014-09-14 ENCOUNTER — Encounter: Payer: Self-pay | Admitting: Podiatry

## 2014-09-14 ENCOUNTER — Ambulatory Visit (INDEPENDENT_AMBULATORY_CARE_PROVIDER_SITE_OTHER): Payer: Commercial Managed Care - PPO | Admitting: Podiatry

## 2014-09-14 VITALS — BP 131/77 | HR 62 | Resp 16

## 2014-09-14 DIAGNOSIS — M1990 Unspecified osteoarthritis, unspecified site: Secondary | ICD-10-CM | POA: Diagnosis not present

## 2014-09-14 NOTE — Patient Instructions (Signed)
I will schedule a follow-up consult with Dr. Earleen Newport to evaluate your foot pain as well as your recent MRI image

## 2014-09-15 NOTE — Progress Notes (Signed)
Patient ID: Michele Bean, female   DOB: 1965/10/15, 49 y.o.   MRN: [id number]  Subjective: This patient presents to review the results of MRI image right foot scan dated 08/29/2014. Patient has bilateral foot pain right greater than left with symptoms beginning approximately February 2016. The symptoms occur primarily on weightbearing however, lingular somewhat with rest. At the onset of the symptoms a Cam Walker boot was worn with some temporary reduction of symptoms. Patient's also had screening arthritis and which within normal limits. She is also taking tramadol which reduces some of her symptoms  Objective: Results of MRI image dated 08/29/2014 Arthropathy at the first tarsometatarsal joint dorsally observed with erosion or degenerative subcortical cyst in the medial cuneiform and marrow edema in the dorsally in the medial cuneiform at the base of the first metatarsal. There is also some surrounding soft tissue edema appears to correspond to the point tenderness. This focal arthropathy appears new compared to 01/15/2008. However, the adjacent Lisfranc ligament appears intact Flattened longitudinal arch of the foot  Assessment: MRI diagnose arthropathy in the right foot as described above  Plan: I reviewed the results of MRI image with patient today. At this time I am referring patient to Dr. Earleen Newport for further evaluation and treatment recommendations.  Reschedule patient for consultation with Dr. Earleen Newport

## 2014-09-24 ENCOUNTER — Ambulatory Visit: Payer: Commercial Managed Care - PPO | Admitting: Podiatry

## 2014-10-19 ENCOUNTER — Other Ambulatory Visit: Payer: Self-pay

## 2014-10-19 DIAGNOSIS — Z1231 Encounter for screening mammogram for malignant neoplasm of breast: Secondary | ICD-10-CM

## 2014-11-24 ENCOUNTER — Ambulatory Visit
Admission: RE | Admit: 2014-11-24 | Discharge: 2014-11-24 | Disposition: A | Discharge status: Home / Self Care | Payer: Commercial Managed Care - PPO | Source: Ambulatory Visit

## 2014-11-24 ENCOUNTER — Other Ambulatory Visit: Payer: Self-pay

## 2014-11-24 ENCOUNTER — Other Ambulatory Visit (HOSPITAL_COMMUNITY): Payer: Self-pay

## 2014-11-24 DIAGNOSIS — Z1231 Encounter for screening mammogram for malignant neoplasm of breast: Secondary | ICD-10-CM

## 2015-03-11 ENCOUNTER — Encounter (HOSPITAL_COMMUNITY): Payer: Self-pay | Admitting: *Deleted

## 2015-03-11 ENCOUNTER — Inpatient Hospital Stay (HOSPITAL_COMMUNITY)
Admission: EM | Admit: 2015-03-11 | Discharge: 2015-03-11 | Disposition: A | Discharge status: Home / Self Care | Payer: Commercial Managed Care - PPO | Source: Ambulatory Visit | Attending: Obstetrics & Gynecology | Admitting: Obstetrics & Gynecology

## 2015-03-11 DIAGNOSIS — E039 Hypothyroidism, unspecified: Secondary | ICD-10-CM | POA: Diagnosis not present

## 2015-03-11 DIAGNOSIS — R1032 Left lower quadrant pain: Secondary | ICD-10-CM | POA: Insufficient documentation

## 2015-03-11 DIAGNOSIS — K5900 Constipation, unspecified: Secondary | ICD-10-CM | POA: Insufficient documentation

## 2015-03-11 DIAGNOSIS — K219 Gastro-esophageal reflux disease without esophagitis: Secondary | ICD-10-CM | POA: Insufficient documentation

## 2015-03-11 LAB — CBC
HCT: 34.5 % — ABNORMAL LOW (ref 36.0–46.0)
HEMOGLOBIN: 10.6 g/dL — AB (ref 12.0–15.0)
MCH: 18.1 pg — AB (ref 26.0–34.0)
MCHC: 30.7 g/dL (ref 30.0–36.0)
MCV: 59 fL — ABNORMAL LOW (ref 78.0–100.0)
Platelets: 206 10*3/uL (ref 150–400)
RBC: 5.85 MIL/uL — AB (ref 3.87–5.11)
RDW: 17.4 % — ABNORMAL HIGH (ref 11.5–15.5)
WBC: 11.3 10*3/uL — AB (ref 4.0–10.5)

## 2015-03-11 LAB — URINALYSIS, ROUTINE W REFLEX MICROSCOPIC
Bilirubin Urine: NEGATIVE
Glucose, UA: NEGATIVE mg/dL
Hgb urine dipstick: NEGATIVE
Ketones, ur: NEGATIVE mg/dL
Leukocytes, UA: NEGATIVE
Nitrite: NEGATIVE
Protein, ur: NEGATIVE mg/dL
Specific Gravity, Urine: >1.03 — ABNORMAL HIGH (ref 1.005–1.030)
pH: 5.5 (ref 5.0–8.0)

## 2015-03-11 MED ORDER — TRAMADOL HCL 50 MG PO TABS: 50.0000 mg | ORAL_TABLET | Freq: Four times a day (QID) | ORAL | Status: DC | PRN

## 2015-03-11 MED ORDER — KETOROLAC TROMETHAMINE 60 MG/2ML IM SOLN
60.0000 mg | Freq: Once | INTRAMUSCULAR | Status: AC
  Administered 2015-03-11: 60 mg via INTRAMUSCULAR
  Filled 2015-03-11: qty 2

## 2015-03-11 NOTE — Discharge Instructions (Signed)

## 2015-03-11 NOTE — MAU Note (Signed)
WAS SEEN AT [facility]   IN July - 2015

## 2015-03-11 NOTE — MAU Provider Note (Signed)
History     CSN: [id number]  Arrival date and time: 03/11/15 1726   First Provider Initiated Contact with Patient 03/11/15 2029         Chief Complaint  Patient presents with  . Abdominal Pain   HPI  Michele Bean is a 49 y.o. female who presents with LLQ pain.  Pain x 1 month. Reports as sharp & intermittent. Rates pain 8/10. Treats occassionally with aleve with minimal relief. Denies vaginal bleeding or discharge.  Denies n/v/d.  Reports some constipation. Has BM daily but states stools are hard & she has to strain.  Denies fever.  Had hysterectomy & salpingectomy in 2013 by Dr. Raphael Gibney. Last seen 2 years ago. Still has both ovaries.    OB History    Gravida Para Term Preterm AB TAB SAB Ectopic Multiple Living   0         0      Past Medical History  Diagnosis Date  . Hx of menorrhagia   . Fibroid   . Thyroid dysfunction   . Yeast vaginitis 2010  . Anemia   . H/O low back pain   . Abnormal Pap smear   . Seasonal allergies   . Hypothyroidism   . Lower back pain     tx with naprosyn  . GERD (gastroesophageal reflux disease)   . Headache(784.0)     OTC meds PRN    Past Surgical History  Procedure Laterality Date  . Myomectomy  2000  . Wisdom tooth extraction    . Nasal sinus surgery  2006, 2007    x 2  . Arthroscopic knee surgery      left  . Foot surgery  1999    bilateral - hammer toes  . Laparoscopic assisted vaginal hysterectomy  10/17/2011    Procedure: LAPAROSCOPIC ASSISTED VAGINAL HYSTERECTOMY;  Surgeon: Ena Dawley, MD;  Location: [facility];  Service: Gynecology;  Laterality: N/A;  Possible TAH, 3 hours   . Abdominal hysterectomy  10/17/2011    Procedure: HYSTERECTOMY ABDOMINAL;  Surgeon: Ena Dawley, MD;  Location: [facility];  Service: Gynecology;  Laterality: N/A;  converted to abdominal 1326    Family History  Problem Relation Age of Onset  . Obesity Father     Social History  Substance Use Topics  . Smoking status: Never Smoker    . Smokeless tobacco: Never Used  . Alcohol Use: No    Allergies: No Known Allergies  Prescriptions prior to admission  Medication Sig Dispense Refill Last Dose  . Cholecalciferol (VITAMIN D3) 5000 units CAPS Take 5,000 Units by mouth daily.   Past Week at Unknown time  . fexofenadine (ALLEGRA) 180 MG tablet Take 180 mg by mouth daily.    03/11/2015 at Unknown time  . hydrochlorothiazide (MICROZIDE) 12.5 MG capsule Take 12.5 mg by mouth daily.    Past Month at Unknown time  . levothyroxine (SYNTHROID, LEVOTHROID) 50 MCG tablet Take 50 mcg by mouth daily before breakfast.    03/11/2015 at Unknown time  . amoxicillin-clavulanate (AUGMENTIN) 875-125 MG tablet TAKE 1 TABLET BY MOUTH 2 TIMES DAILY FOR 14 DAYS.  0     Review of Systems  Constitutional: Negative.   Gastrointestinal: Positive for abdominal pain and constipation. Negative for nausea, vomiting and diarrhea.  Genitourinary: Negative.    Physical Exam   Blood pressure 133/85, pulse 57, temperature 97.6 F (36.4 C), temperature source Oral, resp. rate 18, last menstrual period 09/25/2011.  Physical Exam  Nursing note and  vitals reviewed. Constitutional: She is oriented to person, place, and time. She appears well-developed and well-nourished. No distress.  HENT:  Head: Normocephalic and atraumatic.  Eyes: Conjunctivae are normal. Right eye exhibits no discharge. Left eye exhibits no discharge. No scleral icterus.  Neck: Normal range of motion.  Cardiovascular: Normal rate, regular rhythm and normal heart sounds.   No murmur heard. Respiratory: Effort normal and breath sounds normal. No respiratory distress. She has no wheezes.  GI: Soft. Bowel sounds are normal. She exhibits no distension and no mass. There is tenderness in the left lower quadrant. There is no rebound, no guarding and no tenderness at McBurney's point.  Genitourinary: Right adnexum displays no mass, no tenderness and no fullness. Left adnexum displays no  mass, no tenderness and no fullness.  Neurological: She is alert and oriented to person, place, and time.  Skin: Skin is warm and dry. She is not diaphoretic.  Psychiatric: She has a normal mood and affect. Her behavior is normal. Judgment and thought content normal.    MAU Course  Procedures Results for orders placed or performed during the hospital encounter of 03/11/15 (from the past 24 hour(s))  Urinalysis, Routine w reflex microscopic (not at [facility])     Status: Abnormal   Collection Time: 03/11/15  6:15 PM  Result Value Ref Range   Color, Urine YELLOW YELLOW   APPearance CLEAR CLEAR   Specific Gravity, Urine >1.030 (H) 1.005 - 1.030   pH 5.5 5.0 - 8.0   Glucose, UA NEGATIVE NEGATIVE mg/dL   Hgb urine dipstick NEGATIVE NEGATIVE   Bilirubin Urine NEGATIVE NEGATIVE   Ketones, ur NEGATIVE NEGATIVE mg/dL   Protein, ur NEGATIVE NEGATIVE mg/dL   Nitrite NEGATIVE NEGATIVE   Leukocytes, UA NEGATIVE NEGATIVE  CBC     Status: Abnormal   Collection Time: 03/11/15  7:20 PM  Result Value Ref Range   WBC 11.3 (H) 4.0 - 10.5 K/uL   RBC 5.85 (H) 3.87 - 5.11 MIL/uL   Hemoglobin 10.6 (L) 12.0 - 15.0 g/dL   HCT 34.5 (L) 36.0 - 46.0 %   MCV 59.0 (L) 78.0 - 100.0 fL   MCH 18.1 (L) 26.0 - 34.0 pg   MCHC 30.7 30.0 - 36.0 g/dL   RDW 17.4 (H) 11.5 - 15.5 %   Platelets 206 150 - 400 K/uL    MDM CBC  Toradol IM Normal exam Pt in no apparent distress Pt would like to f/u with [facility] instead of clinic Assessment and Plan  A: 1. Abdominal pain, left lower quadrant    P; Discharge home Rx ultram Encouraged increased fluids, fiber intake, & stool softeners Return to [facility] if symptoms worsen Call [facility] to schedule f/u to evaluate ovaries.   Jorje Guild, NP  03/11/2015, 8:10 PM

## 2015-03-11 NOTE — MAU Note (Signed)
LLQ sharp abd pain x 1 month, has had hysterectomy.  Pain is very sharp with walking.

## 2015-04-24 ENCOUNTER — Encounter: Payer: Self-pay | Admitting: Podiatry

## 2015-04-24 ENCOUNTER — Ambulatory Visit (INDEPENDENT_AMBULATORY_CARE_PROVIDER_SITE_OTHER): Payer: Commercial Managed Care - PPO | Admitting: Podiatry

## 2015-04-24 VITALS — BP 146/84 | HR 59 | Resp 16

## 2015-04-24 DIAGNOSIS — M722 Plantar fascial fibromatosis: Secondary | ICD-10-CM

## 2015-04-24 DIAGNOSIS — L84 Corns and callosities: Secondary | ICD-10-CM | POA: Diagnosis not present

## 2015-04-24 DIAGNOSIS — M779 Enthesopathy, unspecified: Secondary | ICD-10-CM | POA: Diagnosis not present

## 2015-04-24 MED ORDER — TRIAMCINOLONE ACETONIDE 10 MG/ML IJ SUSP
10.0000 mg | Freq: Once | INTRAMUSCULAR | Status: AC
  Administered 2015-04-24: 10 mg

## 2015-04-24 NOTE — Progress Notes (Signed)
Subjective:     Patient ID: Michele Bean, female   DOB: 08-Dec-1965, 50 y.o.   MRN: [id number]  HPI patient states my left heel is doing some better but the top of my right foot seems quite sore and I have these lesions on the bottom of my left foot   Review of Systems     Objective:   Physical Exam  neurovascular status intact muscle strength was adequate with mild discomfort plantar aspect left heel quite a bit of discomfort in a sporadic pattern dorsum right foot with no specific area with no pain at the first metatarsocuneiform joint and to keratotic lesions plantar aspect left foot    Assessment:      what appears to be some form of dorsal tendinitis right not related to wear the MRI showed a small  Cyst around the first metatarsocuneiform with mild plantar fasciitis left and also porokeratotic lesions    Plan:      reviewed all conditions were to try to work on the top of the right foot first with injection treatment 3 mg Kenalog 5 mg Xylocaine and heat ice treatment. The left he will be left alone as is had several injections in the past and I did debridement lesions left and we'll see back in 4 weeks to see the results

## 2015-05-03 ENCOUNTER — Ambulatory Visit: Payer: Commercial Managed Care - PPO | Admitting: Podiatry

## 2015-05-22 ENCOUNTER — Ambulatory Visit: Payer: Commercial Managed Care - PPO | Admitting: Podiatry

## 2015-06-07 ENCOUNTER — Ambulatory Visit (INDEPENDENT_AMBULATORY_CARE_PROVIDER_SITE_OTHER): Payer: Commercial Managed Care - PPO | Admitting: Podiatry

## 2015-06-07 ENCOUNTER — Encounter: Payer: Self-pay | Admitting: Podiatry

## 2015-06-07 VITALS — BP 136/89 | HR 56 | Resp 16

## 2015-06-07 DIAGNOSIS — M722 Plantar fascial fibromatosis: Secondary | ICD-10-CM | POA: Diagnosis not present

## 2015-06-07 DIAGNOSIS — R600 Localized edema: Secondary | ICD-10-CM | POA: Diagnosis not present

## 2015-06-07 DIAGNOSIS — S93601A Unspecified sprain of right foot, initial encounter: Secondary | ICD-10-CM

## 2015-06-07 MED ORDER — TRIAMCINOLONE ACETONIDE 10 MG/ML IJ SUSP
10.0000 mg | Freq: Once | INTRAMUSCULAR | Status: AC
  Administered 2015-06-07: 10 mg

## 2015-06-07 NOTE — Progress Notes (Signed)
Subjective:     Patient ID: Michele Bean, female   DOB: Sep 12, 1965, 50 y.o.   MRN: [id number]  HPI patient states my right foot is doing very well with discomfort in the left plantar heel with fluid buildup noted   Review of Systems     Objective:   Physical Exam Neurovascular status intact with significant diminishment of discomfort in the right dorsal lateral foot with discomfort in the plantar heel left with moderate intensity    Assessment:     Tendinitis right improved with plantar fascial symptomatology left    Plan:     Due to pain left I did inject 3 mg Kenalog 5 mg Xylocaine advised on not going barefoot and continued orthotic usage. Patient will be seen back as needed

## 2015-08-09 ENCOUNTER — Encounter: Payer: Commercial Managed Care - PPO | Admitting: Podiatry

## 2015-08-10 ENCOUNTER — Encounter: Payer: Self-pay | Admitting: Podiatry

## 2015-08-10 ENCOUNTER — Ambulatory Visit (INDEPENDENT_AMBULATORY_CARE_PROVIDER_SITE_OTHER): Payer: Commercial Managed Care - PPO | Admitting: Podiatry

## 2015-08-10 ENCOUNTER — Ambulatory Visit (INDEPENDENT_AMBULATORY_CARE_PROVIDER_SITE_OTHER): Payer: Commercial Managed Care - PPO

## 2015-08-10 DIAGNOSIS — M722 Plantar fascial fibromatosis: Secondary | ICD-10-CM | POA: Diagnosis not present

## 2015-08-10 DIAGNOSIS — M79672 Pain in left foot: Secondary | ICD-10-CM

## 2015-08-10 DIAGNOSIS — M779 Enthesopathy, unspecified: Secondary | ICD-10-CM

## 2015-08-10 MED ORDER — TRIAMCINOLONE ACETONIDE 10 MG/ML IJ SUSP
10.0000 mg | Freq: Once | INTRAMUSCULAR | Status: AC
  Administered 2015-08-10: 10 mg

## 2015-08-10 MED ORDER — DICLOFENAC SODIUM 75 MG PO TBEC: 75.0000 mg | DELAYED_RELEASE_TABLET | Freq: Two times a day (BID) | ORAL | Status: DC

## 2015-08-10 NOTE — Progress Notes (Signed)
Subjective:     Patient ID: Michele Bean, female   DOB: [birth year], 50 y.o.   MRN: [id number]  HPI patient presents with inflammation of the dorsum of the right foot and pain in the left ankle with inflammation fluid buildup. Patient states that this is been getting worse over the last several months   Review of Systems     Objective:   Physical Exam Neurovascular status intact muscle strength adequate with inflammation of the dorsum of the right foot around the extensor complex and exquisite discomfort in the left ankle around the sinus tarsi with fluid buildup. Patient has pain when I inverted and everted the left foot    Assessment:     Inflammatory tendinitis dorsum right with sinus tarsitis with inflammation left    Plan:     H&P and both conditions discussed and did a dorsal injection right 3 mg Kenalog 5 mg Xylocaine and injected the sinus tarsi left 3 mg Kenalog 5 mill grams Xylocaine and applied fascial brace left to reduce motion. Reappoint to recheck

## 2015-08-31 ENCOUNTER — Ambulatory Visit: Payer: Commercial Managed Care - PPO | Admitting: Podiatry

## 2015-10-19 ENCOUNTER — Other Ambulatory Visit: Payer: Self-pay | Admitting: Obstetrics and Gynecology

## 2015-10-19 DIAGNOSIS — Z1231 Encounter for screening mammogram for malignant neoplasm of breast: Secondary | ICD-10-CM

## 2015-11-09 NOTE — Progress Notes (Signed)
This encounter was created in error - please disregard.

## 2015-11-20 ENCOUNTER — Encounter: Payer: Self-pay | Admitting: Podiatry

## 2015-11-20 ENCOUNTER — Ambulatory Visit (INDEPENDENT_AMBULATORY_CARE_PROVIDER_SITE_OTHER): Payer: BLUE CROSS/BLUE SHIELD | Admitting: Podiatry

## 2015-11-20 ENCOUNTER — Ambulatory Visit (INDEPENDENT_AMBULATORY_CARE_PROVIDER_SITE_OTHER): Payer: BLUE CROSS/BLUE SHIELD

## 2015-11-20 DIAGNOSIS — M779 Enthesopathy, unspecified: Secondary | ICD-10-CM | POA: Diagnosis not present

## 2015-11-20 DIAGNOSIS — M79672 Pain in left foot: Secondary | ICD-10-CM

## 2015-11-20 DIAGNOSIS — M79671 Pain in right foot: Secondary | ICD-10-CM

## 2015-11-20 MED ORDER — TRIAMCINOLONE ACETONIDE 10 MG/ML IJ SUSP
10.0000 mg | Freq: Once | INTRAMUSCULAR | Status: AC
  Administered 2015-11-20: 10 mg

## 2015-11-20 NOTE — Progress Notes (Signed)
Subjective:     Patient ID: Michele Bean, female   DOB: 1965/04/06, 50 y.o.   MRN: [id number]  HPI patient presents stating I'm still getting pain on top of my right foot and in my left ankle and I had several months of relief followed by discomfort again   Review of Systems     Objective:   Physical Exam Neurovascular status intact negative Homan sign was noted with patient found to have discomfort in the dorsal aspect of the right foot with pain also in the left sinus tarsi with inflammation and fluid of the sinus tarsi itself. Mild ankle edema noted bilateral nonpitting in nature    Assessment:     Continued arthritic issues with chronic irritation to the dorsum of the right foot and into the left sinus tarsi    Plan:     Injected the dorsal tendon complex right 3 mg Kenalog 5 mg Xylocaine and the left sinus tarsi 3 mg Kenalog 5 mill grams Xylocaine and wrote a prescription for compounding creams. Instructed if symptoms were to get worse we may need to consider more aggressive treatments but at this time x-rays were reviewed with patient  X-ray report was negative for signs of fracture and does indicate minimal sides of advanced arthritic processes

## 2015-11-27 ENCOUNTER — Ambulatory Visit
Admission: RE | Admit: 2015-11-27 | Discharge: 2015-11-27 | Disposition: A | Discharge status: Home / Self Care | Payer: BLUE CROSS/BLUE SHIELD | Source: Ambulatory Visit | Attending: Obstetrics and Gynecology | Admitting: Obstetrics and Gynecology

## 2015-11-27 DIAGNOSIS — Z1231 Encounter for screening mammogram for malignant neoplasm of breast: Secondary | ICD-10-CM

## 2015-11-28 ENCOUNTER — Ambulatory Visit: Payer: Commercial Managed Care - PPO | Admitting: Endocrinology

## 2015-12-25 ENCOUNTER — Encounter: Payer: Self-pay | Admitting: Endocrinology

## 2015-12-25 ENCOUNTER — Ambulatory Visit (INDEPENDENT_AMBULATORY_CARE_PROVIDER_SITE_OTHER): Payer: BLUE CROSS/BLUE SHIELD | Admitting: Endocrinology

## 2015-12-25 VITALS — BP 130/78 | HR 76 | Temp 98.1°F | Resp 16 | Ht 64.0 in | Wt 237.6 lb

## 2015-12-25 DIAGNOSIS — E213 Hyperparathyroidism, unspecified: Secondary | ICD-10-CM

## 2015-12-25 LAB — RENAL FUNCTION PANEL
Albumin: 4.3 g/dL (ref 3.5–5.2)
BUN: 15 mg/dL (ref 6–23)
CALCIUM: 10.3 mg/dL (ref 8.4–10.5)
CHLORIDE: 106 meq/L (ref 96–112)
CO2: 28 meq/L (ref 19–32)
CREATININE: 0.83 mg/dL (ref 0.40–1.20)
GFR: 94.44 mL/min (ref >60.00)
Glucose, Bld: 92 mg/dL (ref 70–99)
Phosphorus: 2.6 mg/dL (ref 2.3–4.6)
Potassium: 3.8 mEq/L (ref 3.5–5.1)
Sodium: 140 mEq/L (ref 135–145)

## 2015-12-25 NOTE — Progress Notes (Signed)
Patient ID: Michele Bean, female   DOB: 1965/09/26, 50 y.o.   MRN: [id number]            Chief complaint: High calcium  History of Present Illness:  Referring physician: [facility]   Review of records for calcium or only available for 2017 from PCP Please show that she has had a high calcium since 05/2015 with the following results  Calcium in 05/2014 = 10.4 and 5/17 = 10.8 Ionized calcium in 6/17 was 5.8, high  No results found for: CALCIUM  The hypercalcemia is not associated with any pathologic fractures, renal insufficiency, nephrolithiasis, sarcoidosis, known carcinoma, hyperthyroidism.  She does not complain of any bone pain, joint pain, abdominal pain or unusual fatigue  Prior serologic and radiologic studies have included:  PTH level of 73 done in 08/2015 and 82 in 5/17 Vitamin D level in 6/17 was 57  No results found for: PTH, CALCIUM, CAION, PHOS    Patient has been on hydrochlorothiazide for hypertension for probably a year although now she says that for the last few months she probably has taken it very sporadically, probably about 4-5 times a month      Medication List       Accurate as of 12/25/15  2:14 PM. Always use your most recent med list.          clobetasol ointment 0.05 % Commonly known as:  TEMOVATE Apply to affected areas of scalp 3-5 times weekly   diclofenac 75 MG EC tablet Commonly known as:  VOLTAREN Take 1 tablet (75 mg total) by mouth 2 (two) times daily.   fexofenadine 180 MG tablet Commonly known as:  ALLEGRA Take 180 mg by mouth daily.   fexofenadine-pseudoephedrine 180-240 MG 24 hr tablet Commonly known as:  ALLEGRA-D 24 Take 1 tablet by mouth.   fluticasone 50 MCG/ACT nasal spray Commonly known as:  FLONASE Place 2 sprays into the nose.   hydrochlorothiazide 12.5 MG capsule Commonly known as:  MICROZIDE Take 12.5 mg by mouth daily.   levothyroxine 50 MCG tablet Commonly known as:  SYNTHROID, LEVOTHROID Take 50 mcg by  mouth daily before breakfast.   traMADol 50 MG tablet Commonly known as:  ULTRAM Take 1 tablet (50 mg total) by mouth every 6 (six) hours as needed for severe pain.   Vitamin D (Ergocalciferol) 50000 units Caps capsule Commonly known as:  DRISDOL Take 50,000 Units by mouth.   Vitamin D3 5000 units Caps Take 5,000 Units by mouth daily.       Allergies:  Allergies  Allergen Reactions  . Tetanus Toxoid Adsorbed Rash    Past Medical History:  Diagnosis Date  . Abnormal Pap smear   . Anemia   . Fibroid   . GERD (gastroesophageal reflux disease)   . H/O low back pain   . Headache(784.0)    OTC meds PRN  . Hx of menorrhagia   . Hypothyroidism   . Lower back pain    tx with naprosyn  . Seasonal allergies   . Thyroid dysfunction   . Yeast vaginitis 2010    Past Surgical History:  Procedure Laterality Date  . ABDOMINAL HYSTERECTOMY  10/17/2011   Procedure: HYSTERECTOMY ABDOMINAL;  Surgeon: Ena Dawley, MD;  Location: [facility];  Service: Gynecology;  Laterality: N/A;  converted to abdominal 1326  . arthroscopic knee surgery     left  . FOOT SURGERY  1999   bilateral - hammer toes  . LAPAROSCOPIC ASSISTED VAGINAL HYSTERECTOMY  10/17/2011  Procedure: LAPAROSCOPIC ASSISTED VAGINAL HYSTERECTOMY;  Surgeon: Ena Dawley, MD;  Location: [facility];  Service: Gynecology;  Laterality: N/A;  Possible TAH, 3 hours   . MYOMECTOMY  2000  . NASAL SINUS SURGERY  2006, 2007   x 2  . WISDOM TOOTH EXTRACTION      Family History  Problem Relation Age of Onset  . Obesity Father     Social History:  reports that she has never smoked. She has never used smokeless tobacco. She reports that she does not drink alcohol or use drugs.  Review of Systems  Constitutional: Negative for weight gain and malaise.  HENT: Negative for headaches.   Respiratory: Negative for shortness of breath.   Cardiovascular: Negative for leg swelling.  Gastrointestinal: Negative for constipation and abdominal  pain.  Endocrine: Negative for fatigue.       She has had hot flashes since her hysterectomy in 2013 and has not been on any HRT.  Has difficulty sleeping at night because of hot flashes  Musculoskeletal: Negative for joint pain and back pain.  Neurological: Negative for numbness.  Psychiatric/Behavioral: Positive for insomnia.Negative for depressed mood.   HYPOTHYROIDISM: Details are not available but she may have had mild hypothyroidism since 2011 associated with positive thyroid antibody She has had stable normal TSH levels for the last 2 years with 50 g levothyroxine which she takes regularly  Also has had vitamin D deficiency being treated with vitamin D 1000 units daily   EXAM:  BP 130/78   Pulse 76   Temp 98.1 F (36.7 C)   Resp 16   Ht 5\' 4"  (1.626 m)   Wt 237 lb 9.6 oz (107.8 kg)   LMP 09/25/2011   SpO2 97%   BMI 40.78 kg/m   GENERAL: Averagely built and has generalized obesity  No pallor, clubbing, lymphadenopathy or edema.  Skin:  no rash or pigmentation.  EYES:  Externally normal.  Fundii:  normal discs and vessels.  ENT: Oral mucosa and tongue normal.  THYROID:  Not palpable.  HEART:  Normal  S1 and S2; no murmur or click.  CHEST:  Normal shape Lungs:   Vescicular breath sounds heard equally.  No crepitations/ wheeze.  ABDOMEN:  No distention.  Liver and spleen not palpable.  No other mass or tenderness.  NEUROLOGICAL: .Reflexes are normal bilaterally at biceps.  SPINE AND JOINTS:  Normal.   Assessment/Plan:   HYPERCALCEMIA: This appears to be from mild hyperparathyroidism, highest calcium 10.8 and associated with high PTH levels Duration of hypercalcemia is unknown  Discussed the nature of primary hyperparathyroidism as well as normal role of the parathyroid glands. Discussed potential  effects of hyperparathyroidism long-term on bone health, kidney stones and kidney function Explained to patient that surgery is indicated only there are symptoms  of high calcium, calcium level over 1 point above the normal range or known osteoporosis.  Explained that if surgery is indicated this would be done after doing a parathyroid scan and most likely if the patient has single adenoma will need minimally invasive surgery  Will repeat her calcium level today and assess her bone density The patient is more likely to have osteopenia because of her reportedly having premature menopause after her hysterectomy Today will repeat her renal panel  Also needs 24-hour urine calcium  MENOPAUSAL symptoms: Not clear if she has had evaluation of her exercise level from gynecologist but she has had persistent typical hot flashes and she will discussed treatment with HRT with upcoming visit  with gynecologist tomorrow  Mild hypothyroidism: Not clear if she had any typical baseline symptoms but her TSH is to be stable with 50 g levothyroxine and she does not have a goiter.  Explained that this is not in any way connected to her hyperparathyroidism  Consultation copy to be sent to PCP  [facility] 12/25/2015, 2:14 PM   Addendum: Calcium level 10.3 with normal phosphorus and renal function

## 2015-12-27 NOTE — Progress Notes (Signed)
Please let patient know that the calcium is back down to normal, may skip the urine collection if she has not done it but need to do the bone density

## 2016-01-01 NOTE — Addendum Note (Signed)
Addended by: Kaylyn Lim I on: 01/01/2016 04:09 PM   Modules accepted: Orders

## 2016-01-02 ENCOUNTER — Telehealth: Payer: Self-pay | Admitting: *Deleted

## 2016-01-02 LAB — CREATININE, URINE, 24 HOUR
Creatinine, 24H Ur: 702 mg/24 hr — ABNORMAL LOW (ref 800–1800)
Creatinine, Urine: 121 mg/dL

## 2016-01-02 LAB — CALCIUM, URINE, 24 HOUR
CALCIUM 24H UR: 140.4 mg/(24.h) (ref 100.0–300.0)
Calcium, Urine: 24.2 mg/dL

## 2016-01-02 NOTE — Telephone Encounter (Signed)
Pt requested the phone number to the compounding pharmacy.  Left message informing pt the [place] [phone].

## 2016-03-21 ENCOUNTER — Ambulatory Visit: Payer: BLUE CROSS/BLUE SHIELD | Admitting: Podiatry

## 2016-04-24 ENCOUNTER — Ambulatory Visit: Payer: BLUE CROSS/BLUE SHIELD | Admitting: Podiatry

## 2016-05-28 ENCOUNTER — Ambulatory Visit: Payer: BLUE CROSS/BLUE SHIELD | Admitting: Neurology

## 2016-05-28 ENCOUNTER — Telehealth: Payer: Self-pay | Admitting: *Deleted

## 2016-05-28 NOTE — Telephone Encounter (Signed)
No showed new patient appointment. 

## 2016-05-29 ENCOUNTER — Encounter: Payer: Self-pay | Admitting: Neurology

## 2016-06-19 DIAGNOSIS — D569 Thalassemia, unspecified: Secondary | ICD-10-CM | POA: Insufficient documentation

## 2016-06-19 DIAGNOSIS — I10 Essential (primary) hypertension: Secondary | ICD-10-CM | POA: Insufficient documentation

## 2016-06-19 DIAGNOSIS — R001 Bradycardia, unspecified: Secondary | ICD-10-CM | POA: Insufficient documentation

## 2016-07-11 ENCOUNTER — Ambulatory Visit: Payer: BLUE CROSS/BLUE SHIELD | Admitting: Podiatry

## 2016-07-19 ENCOUNTER — Ambulatory Visit (INDEPENDENT_AMBULATORY_CARE_PROVIDER_SITE_OTHER): Payer: BLUE CROSS/BLUE SHIELD | Admitting: Podiatry

## 2016-07-19 ENCOUNTER — Encounter: Payer: Self-pay | Admitting: Podiatry

## 2016-07-19 DIAGNOSIS — M779 Enthesopathy, unspecified: Secondary | ICD-10-CM | POA: Diagnosis not present

## 2016-07-19 DIAGNOSIS — M722 Plantar fascial fibromatosis: Secondary | ICD-10-CM | POA: Diagnosis not present

## 2016-07-19 MED ORDER — TRIAMCINOLONE ACETONIDE 10 MG/ML IJ SUSP
10.0000 mg | Freq: Once | INTRAMUSCULAR | Status: AC
  Administered 2016-07-19: 10 mg

## 2016-07-21 NOTE — Progress Notes (Signed)
Subjective:    Patient ID: Michele Bean, female   DOB: 51 y.o.   MRN: [id number]   HPI patient states that my heels have started to hurt again and it's both of them that are bothering me    ROS      Objective:  Physical Exam Neurovascular status intact with inflammatory changes in the tendon inflammation fluid buildup with pain    Assessment:    Fasciitis and tendinitis condition present acute in nature     Plan:     Sterile prep done injected the plantar fascia bilateral 3 mg Kenalog 5 mg Xylocaine and instructed on physical therapy and reappoint to reevaluate in 2 weeks

## 2016-07-22 ENCOUNTER — Encounter: Payer: Self-pay | Admitting: Neurology

## 2016-07-22 ENCOUNTER — Ambulatory Visit (INDEPENDENT_AMBULATORY_CARE_PROVIDER_SITE_OTHER): Payer: BLUE CROSS/BLUE SHIELD | Admitting: Neurology

## 2016-07-22 DIAGNOSIS — R51 Headache: Secondary | ICD-10-CM

## 2016-07-22 DIAGNOSIS — R519 Headache, unspecified: Secondary | ICD-10-CM | POA: Insufficient documentation

## 2016-07-22 MED ORDER — GABAPENTIN 300 MG PO CAPS: 300.0000 mg | ORAL_CAPSULE | Freq: Three times a day (TID) | ORAL | 11 refills | Status: DC

## 2016-07-22 NOTE — Progress Notes (Signed)
PATIENT: Michele Bean DOB: 06-13-1965  Chief Complaint  Patient presents with  . Trigeminal Neuralgia    Reports lower left jaw pain for the past year.  She has history of three root canals in that area.  She was given gabapentin 400mg , BID that did improve the pain but she stopped this medication when her symptoms started to resolve.  . Dentistry    Michele Bean, DMD - referring MD  . PCP    Rankins, Bill Salinas, MD     [name] is a 51 years old right-handed female, seen in refer by her dentist Dr. Beverely Bean for evaluation of left lower jaw pain, her primary care physician is Dr. Radene Bean, Bill Bean, initial evaluation was on Jul 22 2016.  I reviewed and summarized the referring note, per record, she received a treatment of tooth 19 on January 14 2015, and then developed post operative pain, she was treated with multiple rounds of antibiotics and anti-inflammatory medications with little relief, she continue have persistent pain, deep achy tingly sensation  She received tooth canal at tooth 18 in March 2017, followed by more rounds of antibiotics, with continued left lower jaw symptoms, she is now going to have more procedure done at tooth 20.  Patient also reported that she was giving a prescription of gabapentin, which has helped her symptoms some. She is referred here to rule out focal neurological symptoms.  There was description of new radiating pain since January 2018 along her left jaw from the referring note, patient stated, it is a continuation of her chronic left lower jaw achy pain, pulsating sensation. She denies skin sensitivity, mild tenderness at left mandibular angle  REVIEW OF SYSTEMS: Full 14 system review of systems performed and notable only for chest pain, anemia, snoring, not enough sleep, energy  ALLERGIES: Allergies  Allergen Reactions  . Tetanus Toxoid Adsorbed Rash    HOME MEDICATIONS: Current Outpatient  Prescriptions  Medication Sig Dispense Refill  . clobetasol ointment (TEMOVATE) 0.05 % Apply to affected areas of scalp 3-5 times weekly    . fexofenadine (ALLEGRA) 180 MG tablet Take 180 mg by mouth daily.     . fluticasone (FLONASE) 50 MCG/ACT nasal spray Place 2 sprays into the nose.    . hydrochlorothiazide (MICROZIDE) 12.5 MG capsule Take 12.5 mg by mouth daily.     Marland Kitchen levothyroxine (SYNTHROID, LEVOTHROID) 50 MCG tablet Take 50 mcg by mouth daily before breakfast.      No current facility-administered medications for this visit.     PAST MEDICAL HISTORY: Past Medical History:  Diagnosis Date  . Abnormal Pap smear   . Anemia   . Fibroid   . GERD (gastroesophageal reflux disease)   . H/O Bean back pain   . Headache(784.0)    OTC meds PRN  . Hx of menorrhagia   . Hypothyroidism   . Lower back pain    tx with naprosyn  . Seasonal allergies   . Thyroid dysfunction   . Yeast vaginitis 2010    PAST SURGICAL HISTORY: Past Surgical History:  Procedure Laterality Date  . ABDOMINAL HYSTERECTOMY  10/17/2011   Procedure: HYSTERECTOMY ABDOMINAL;  Surgeon: Ena Dawley, MD;  Location: [facility];  Service: Gynecology;  Laterality: N/A;  converted to abdominal 1326  . arthroscopic knee surgery     left  . FOOT SURGERY  1999   bilateral - hammer toes  . LAPAROSCOPIC ASSISTED VAGINAL HYSTERECTOMY  10/17/2011   Procedure: LAPAROSCOPIC  ASSISTED VAGINAL HYSTERECTOMY;  Surgeon: Ena Dawley, MD;  Location: [facility];  Service: Gynecology;  Laterality: N/A;  Possible TAH, 3 hours   . MYOMECTOMY  2000  . NASAL SINUS SURGERY  2006, 2007   x 2  . ROOT CANAL    . WISDOM TOOTH EXTRACTION      FAMILY HISTORY: Family History  Problem Relation Age of Onset  . Obesity Father   . Thyroid disease Father   . COPD Father   . Atrial fibrillation Father   . Thyroid disease Maternal Aunt   . Diabetes Maternal Aunt   . Healthy Mother     SOCIAL HISTORY:  Social History   Social History  .  Marital status: Single    Spouse name: N/A  . Number of children: 0  . Years of education: 3 years college   Occupational History  . Customer Support Agent/Billing    Social History Main Topics  . Smoking status: Never Smoker  . Smokeless tobacco: Never Used  . Alcohol use No  . Drug use: No  . Sexual activity: No   Other Topics Concern  . Not on file   Social History Narrative   Lives at home with her mother.   Right-handed.   Occasional caffeine use - twice weekly.     PHYSICAL EXAM   Vitals:   07/22/16 0807  BP: 136/82  Pulse: (!) 58  Weight: 237 lb 4 oz (107.6 kg)  Height: 5\' 4"  (1.626 m)    Not recorded      Body mass index is 40.72 kg/m.  PHYSICAL EXAMNIATION:  Gen: NAD, conversant, well nourised, obese, well groomed                     Cardiovascular: Regular rate rhythm, no peripheral edema, warm, nontender. Eyes: Conjunctivae clear without exudates or hemorrhage Neck: Supple, no carotid bruits. Pulmonary: Clear to auscultation bilaterally   NEUROLOGICAL EXAM:  MENTAL STATUS: Speech:    Speech is normal; fluent and spontaneous with normal comprehension.  Cognition:     Orientation to time, place and person     Normal recent and remote memory     Normal Attention span and concentration     Normal Language, naming, repeating,spontaneous speech     Fund of knowledge   CRANIAL NERVES: CN II: Visual fields are full to confrontation. Fundoscopic exam is normal with sharp discs and no vascular changes. Pupils are round equal and briskly reactive to light. CN III, IV, VI: extraocular movement are normal. No ptosis. CN V: Facial sensation is intact to pinprick in all 3 divisions bilaterally. Corneal responses are intact.  CN VII: Face is symmetric with normal eye closure and smile. CN VIII: Hearing is normal to rubbing fingers CN IX, X: Palate elevates symmetrically. Phonation is normal. CN XI: Head turning and shoulder shrug are intact CN XII:  Tongue is midline with normal movements and no atrophy.  MOTOR: There is no pronator drift of out-stretched arms. Muscle bulk and tone are normal. Muscle strength is normal.  REFLEXES: Reflexes are 2+ and symmetric at the biceps, triceps, knees, and ankles. Plantar responses are flexor.  SENSORY: Intact to light touch, pinprick, positional sensation and vibratory sensation are intact in fingers and toes.  COORDINATION: Rapid alternating movements and fine finger movements are intact. There is no dysmetria on finger-to-nose and heel-knee-shin.    GAIT/STANCE: Posture is normal. Gait is steady with normal steps, base, arm swing, and turning. Heel and  toe walking are normal. Tandem gait is normal.  Romberg is absent.   DIAGNOSTIC DATA (LABS, IMAGING, TESTING) - I reviewed patient records, labs, notes, testing and imaging myself where available.   ASSESSMENT AND PLAN  ILIYANA CONVEY is a 51 y.o. female   Left lower jaw pain,  In the distribution of left V3,  Symptoms are not typical for left trigeminal neuralgia  After discuss with patient, we decided to proceed with MRI of the brain with without contrast to rule out structural relation  Laboratory evaluations  Continue as needed NSAIDs, gabapentin   Marcial Pacas, M.D. Ph.D.  [facility] [address], [place], [place] [address] Ph: [phone] Fax: [phone]  [id number], [place], DMD, Rankins, Bill Salinas, MD

## 2016-07-23 ENCOUNTER — Telehealth: Payer: Self-pay | Admitting: Neurology

## 2016-07-23 LAB — COMPREHENSIVE METABOLIC PANEL
ALBUMIN: 4.4 g/dL (ref 3.5–5.5)
ALK PHOS: 89 IU/L (ref 39–117)
ALT: 11 IU/L (ref 0–32)
AST: 12 IU/L (ref 0–40)
Albumin/Globulin Ratio: 1.4 (ref 1.2–2.2)
BILIRUBIN TOTAL: 0.3 mg/dL (ref 0.0–1.2)
BUN / CREAT RATIO: 29 — AB (ref 9–23)
BUN: 20 mg/dL (ref 6–24)
CO2: 23 mmol/L (ref 18–29)
Calcium: 10.7 mg/dL — ABNORMAL HIGH (ref 8.7–10.2)
Chloride: 104 mmol/L (ref 96–106)
Creatinine, Ser: 0.7 mg/dL (ref 0.57–1.00)
GFR calc Af Amer: 118 mL/min/{1.73_m2} (ref >59)
GFR, EST NON AFRICAN AMERICAN: 103 mL/min/{1.73_m2} (ref >59)
GLOBULIN, TOTAL: 3.2 g/dL (ref 1.5–4.5)
Glucose: 83 mg/dL (ref 65–99)
POTASSIUM: 4.4 mmol/L (ref 3.5–5.2)
SODIUM: 140 mmol/L (ref 134–144)
TOTAL PROTEIN: 7.6 g/dL (ref 6.0–8.5)

## 2016-07-23 LAB — CBC
HEMATOCRIT: 36.7 % (ref 34.0–46.6)
HEMOGLOBIN: 11.2 g/dL (ref 11.1–15.9)
MCH: 17.9 pg — AB (ref 26.6–33.0)
MCHC: 30.5 g/dL — AB (ref 31.5–35.7)
MCV: 59 fL — ABNORMAL LOW (ref 79–97)
Platelets: 236 10*3/uL (ref 150–379)
RBC: 6.25 x10E6/uL — AB (ref 3.77–5.28)
RDW: 20.6 % — ABNORMAL HIGH (ref 12.3–15.4)
WBC: 11.6 10*3/uL — ABNORMAL HIGH (ref 3.4–10.8)

## 2016-07-23 NOTE — Telephone Encounter (Signed)
Leeanne Mannan C started a case for the MRI with [employer].. I faxed clinical notes and I just received a fax stated needing more clinical information. The phone number for the peer to peer is [phone]. The member ID is [id number] & dob is [birth year].Marland Kitchen The case closed on Wednesday 07/24/16 at 5:00 pm..

## 2016-07-24 ENCOUNTER — Telehealth: Payer: Self-pay | Admitting: *Deleted

## 2016-07-24 NOTE — Telephone Encounter (Signed)
MRI brain,   At [facility] No [id number], good till August 20, 2016.

## 2016-07-24 NOTE — Telephone Encounter (Signed)
Noted, Thank you for your help! °

## 2016-07-24 NOTE — Telephone Encounter (Signed)
MRI brain,   At [facility] No [id number], good till August 20, 2016.

## 2016-07-25 NOTE — Telephone Encounter (Signed)
I called the patient and scheduled her MRI for 08/07/16 at our [place] mobile unit.

## 2016-08-02 ENCOUNTER — Ambulatory Visit: Payer: BLUE CROSS/BLUE SHIELD | Admitting: Podiatry

## 2016-08-07 ENCOUNTER — Ambulatory Visit (INDEPENDENT_AMBULATORY_CARE_PROVIDER_SITE_OTHER): Payer: BLUE CROSS/BLUE SHIELD

## 2016-08-07 ENCOUNTER — Other Ambulatory Visit: Payer: BLUE CROSS/BLUE SHIELD

## 2016-08-07 ENCOUNTER — Telehealth: Payer: Self-pay | Admitting: Neurology

## 2016-08-07 NOTE — Telephone Encounter (Signed)
Disregard this I believe they can use the other order.Michele Bean

## 2016-08-07 NOTE — Telephone Encounter (Signed)
Can you put in another order for the MRI Brain w/wo contrast. She was unable to have it here at our [place] mobile unit because it was too tight she could not do it and she wants to do it at GI and they will not schedule her with the orders that we already have since we scheduled her here.Michele Bean

## 2016-09-18 ENCOUNTER — Encounter: Payer: Self-pay | Admitting: Podiatry

## 2016-09-18 ENCOUNTER — Encounter: Payer: BLUE CROSS/BLUE SHIELD | Admitting: Podiatry

## 2016-09-18 ENCOUNTER — Ambulatory Visit: Payer: BLUE CROSS/BLUE SHIELD

## 2016-09-18 VITALS — BP 112/73 | HR 49 | Resp 16

## 2016-09-24 ENCOUNTER — Ambulatory Visit: Payer: BLUE CROSS/BLUE SHIELD | Admitting: Neurology

## 2016-10-15 ENCOUNTER — Ambulatory Visit (HOSPITAL_BASED_OUTPATIENT_CLINIC_OR_DEPARTMENT_OTHER): Admit: 2016-10-15 | Payer: BLUE CROSS/BLUE SHIELD | Admitting: Plastic Surgery

## 2016-10-15 ENCOUNTER — Encounter (HOSPITAL_BASED_OUTPATIENT_CLINIC_OR_DEPARTMENT_OTHER): Payer: Self-pay

## 2016-10-15 SURGERY — MAMMOPLASTY, REDUCTION
Anesthesia: General | Laterality: Bilateral

## 2016-10-24 ENCOUNTER — Other Ambulatory Visit: Payer: Self-pay | Admitting: Obstetrics and Gynecology

## 2016-10-24 DIAGNOSIS — Z1231 Encounter for screening mammogram for malignant neoplasm of breast: Secondary | ICD-10-CM

## 2016-11-13 NOTE — Progress Notes (Signed)
This encounter was created in error - please disregard.

## 2016-11-25 ENCOUNTER — Ambulatory Visit (INDEPENDENT_AMBULATORY_CARE_PROVIDER_SITE_OTHER): Payer: BLUE CROSS/BLUE SHIELD | Admitting: Podiatry

## 2016-11-25 ENCOUNTER — Ambulatory Visit (INDEPENDENT_AMBULATORY_CARE_PROVIDER_SITE_OTHER): Payer: BLUE CROSS/BLUE SHIELD

## 2016-11-25 ENCOUNTER — Encounter: Payer: Self-pay | Admitting: Podiatry

## 2016-11-25 VITALS — BP 140/86 | HR 52 | Resp 16

## 2016-11-25 DIAGNOSIS — S93601A Unspecified sprain of right foot, initial encounter: Secondary | ICD-10-CM | POA: Diagnosis not present

## 2016-11-25 DIAGNOSIS — M722 Plantar fascial fibromatosis: Secondary | ICD-10-CM

## 2016-11-25 MED ORDER — TRIAMCINOLONE ACETONIDE 10 MG/ML IJ SUSP
10.0000 mg | Freq: Once | INTRAMUSCULAR | Status: AC
  Administered 2016-11-25: 10 mg

## 2016-11-25 NOTE — Progress Notes (Signed)
Subjective:    Patient ID: Michele Bean, female   DOB: 51 y.o.   MRN: [id number]   HPI patient states she's had trauma to the left ankle with fracture and has developed heel pain left and is concerned the bone may be involved from her injury in July    ROS      Objective:  Physical Exam neurovascular status intact with discomfort lateral side left ankle with mild swelling and discomfort in the plantar heel region medial band left that's very tender when pressed     Assessment:    Acute plantar fasciitis left with inflammation fluid buildup and sprained ankle left with probability for nondisplaced fracture     Plan:   H&P conditions reviewed and today injected the plantar fascia 3 mg Kenalog 5 mg Xylocaine and I discussed continued care for the ankle which was evaluated on x-ray. Patient will be seen back as needed  X-rays indicate no trauma to the calcaneus itself from the previous injury with minimal spur formation and what appears to be healing ankle fracture fibula

## 2016-11-27 ENCOUNTER — Ambulatory Visit
Admission: RE | Admit: 2016-11-27 | Discharge: 2016-11-27 | Disposition: A | Discharge status: Home / Self Care | Payer: BLUE CROSS/BLUE SHIELD | Source: Ambulatory Visit | Attending: Obstetrics and Gynecology | Admitting: Obstetrics and Gynecology

## 2016-11-27 DIAGNOSIS — Z1231 Encounter for screening mammogram for malignant neoplasm of breast: Secondary | ICD-10-CM

## 2016-12-03 ENCOUNTER — Ambulatory Visit: Payer: Self-pay | Admitting: Plastic Surgery

## 2016-12-03 ENCOUNTER — Encounter (HOSPITAL_BASED_OUTPATIENT_CLINIC_OR_DEPARTMENT_OTHER)
Admission: RE | Admit: 2016-12-03 | Discharge: 2016-12-03 | Disposition: A | Discharge status: Home / Self Care | Payer: BLUE CROSS/BLUE SHIELD | Source: Ambulatory Visit | Attending: Plastic Surgery | Admitting: Plastic Surgery

## 2016-12-03 ENCOUNTER — Encounter (HOSPITAL_BASED_OUTPATIENT_CLINIC_OR_DEPARTMENT_OTHER): Payer: Self-pay | Admitting: *Deleted

## 2016-12-03 DIAGNOSIS — N62 Hypertrophy of breast: Secondary | ICD-10-CM | POA: Diagnosis present

## 2016-12-03 DIAGNOSIS — Z01810 Encounter for preprocedural cardiovascular examination: Secondary | ICD-10-CM | POA: Insufficient documentation

## 2016-12-03 DIAGNOSIS — N6011 Diffuse cystic mastopathy of right breast: Secondary | ICD-10-CM | POA: Diagnosis not present

## 2016-12-03 DIAGNOSIS — N6012 Diffuse cystic mastopathy of left breast: Secondary | ICD-10-CM | POA: Diagnosis not present

## 2016-12-03 DIAGNOSIS — I10 Essential (primary) hypertension: Secondary | ICD-10-CM | POA: Insufficient documentation

## 2016-12-03 DIAGNOSIS — Z79899 Other long term (current) drug therapy: Secondary | ICD-10-CM | POA: Diagnosis not present

## 2016-12-03 LAB — BASIC METABOLIC PANEL
ANION GAP: 5 (ref 5–15)
BUN: 13 mg/dL (ref 6–20)
CHLORIDE: 105 mmol/L (ref 101–111)
CO2: 26 mmol/L (ref 22–32)
Calcium: 10.3 mg/dL (ref 8.9–10.3)
Creatinine, Ser: 0.67 mg/dL (ref 0.44–1.00)
GFR calc non Af Amer: >60 mL/min (ref >60)
Glucose, Bld: 93 mg/dL (ref 65–99)
Potassium: 4.4 mmol/L (ref 3.5–5.1)
Sodium: 136 mmol/L (ref 135–145)

## 2016-12-09 NOTE — Anesthesia Preprocedure Evaluation (Signed)
Anesthesia Evaluation  Patient identified by MRN, date of birth, ID band Patient awake    Reviewed: Allergy & Precautions, H&P , Patient's Chart, lab work & pertinent test results, reviewed documented beta blocker date and time   Airway Mallampati: II  TM Distance: >3 FB Neck ROM: full    Dental no notable dental hx.    Pulmonary    Pulmonary exam normal breath sounds clear to auscultation       Cardiovascular hypertension,  Rhythm:regular Rate:Normal     Neuro/Psych    GI/Hepatic   Endo/Other  Hypothyroidism Morbid obesity  Renal/GU      Musculoskeletal   Abdominal   Peds  Hematology   Anesthesia Other Findings   Reproductive/Obstetrics                             Anesthesia Physical Anesthesia Plan  ASA: II  Anesthesia Plan: General   Post-op Pain Management:    Induction: Intravenous  PONV Risk Score and Plan: 2 and Ondansetron and Dexamethasone  Airway Management Planned: Oral ETT  Additional Equipment:   Intra-op Plan:   Post-operative Plan: Extubation in OR  Informed Consent: I have reviewed the patients History and Physical, chart, labs and discussed the procedure including the risks, benefits and alternatives for the proposed anesthesia with the patient or authorized representative who has indicated his/her understanding and acceptance.   Dental Advisory Given  Plan Discussed with: CRNA and Surgeon  Anesthesia Plan Comments: (  )        Anesthesia Quick Evaluation

## 2016-12-10 ENCOUNTER — Ambulatory Visit (HOSPITAL_BASED_OUTPATIENT_CLINIC_OR_DEPARTMENT_OTHER): Payer: BLUE CROSS/BLUE SHIELD | Admitting: Anesthesiology

## 2016-12-10 ENCOUNTER — Ambulatory Visit (HOSPITAL_BASED_OUTPATIENT_CLINIC_OR_DEPARTMENT_OTHER)
Admission: RE | Admit: 2016-12-10 | Discharge: 2016-12-10 | Disposition: A | Discharge status: Home / Self Care | Payer: BLUE CROSS/BLUE SHIELD | Source: Ambulatory Visit | Attending: Plastic Surgery | Admitting: Plastic Surgery

## 2016-12-10 ENCOUNTER — Encounter (HOSPITAL_BASED_OUTPATIENT_CLINIC_OR_DEPARTMENT_OTHER): Payer: Self-pay | Admitting: Anesthesiology

## 2016-12-10 ENCOUNTER — Encounter (HOSPITAL_BASED_OUTPATIENT_CLINIC_OR_DEPARTMENT_OTHER)
Admission: RE | Disposition: A | Discharge status: Home / Self Care | Payer: Self-pay | Source: Ambulatory Visit | Attending: Plastic Surgery

## 2016-12-10 DIAGNOSIS — N62 Hypertrophy of breast: Secondary | ICD-10-CM | POA: Insufficient documentation

## 2016-12-10 DIAGNOSIS — N6012 Diffuse cystic mastopathy of left breast: Secondary | ICD-10-CM | POA: Insufficient documentation

## 2016-12-10 DIAGNOSIS — N6011 Diffuse cystic mastopathy of right breast: Secondary | ICD-10-CM | POA: Insufficient documentation

## 2016-12-10 DIAGNOSIS — Z79899 Other long term (current) drug therapy: Secondary | ICD-10-CM | POA: Insufficient documentation

## 2016-12-10 HISTORY — DX: Trigeminal neuralgia: G50.0

## 2016-12-10 HISTORY — PX: BREAST REDUCTION SURGERY: SHX8

## 2016-12-10 HISTORY — DX: Hypertrophy of breast: N62

## 2016-12-10 HISTORY — DX: Essential (primary) hypertension: I10

## 2016-12-10 HISTORY — PX: REDUCTION MAMMAPLASTY: SUR839

## 2016-12-10 SURGERY — MAMMOPLASTY, REDUCTION
Anesthesia: General | Site: Breast | Laterality: Bilateral

## 2016-12-10 MED ORDER — BACITRACIN ZINC 500 UNIT/GM EX OINT
TOPICAL_OINTMENT | CUTANEOUS | Status: DC | PRN
  Administered 2016-12-10: 1 via TOPICAL

## 2016-12-10 MED ORDER — BUPIVACAINE HCL 0.5 % IJ SOLN
INTRAMUSCULAR | Status: DC | PRN
  Administered 2016-12-10: 30 mL

## 2016-12-10 MED ORDER — BUPIVACAINE LIPOSOME 1.3 % IJ SUSP
INTRAMUSCULAR | Status: DC | PRN
  Administered 2016-12-10: 20 mL

## 2016-12-10 MED ORDER — PHENYLEPHRINE HCL 10 MG/ML IJ SOLN
INTRAMUSCULAR | Status: DC | PRN
  Administered 2016-12-10 (×2): 80 ug via INTRAVENOUS

## 2016-12-10 MED ORDER — LIDOCAINE-EPINEPHRINE 1 %-1:100000 IJ SOLN
INTRAMUSCULAR | Status: AC
  Filled 2016-12-10: qty 1

## 2016-12-10 MED ORDER — MIDAZOLAM HCL 5 MG/5ML IJ SOLN
INTRAMUSCULAR | Status: DC | PRN
  Administered 2016-12-10: 2 mg via INTRAVENOUS

## 2016-12-10 MED ORDER — LIDOCAINE 2% (20 MG/ML) 5 ML SYRINGE
INTRAMUSCULAR | Status: AC
  Filled 2016-12-10: qty 5

## 2016-12-10 MED ORDER — 0.9 % SODIUM CHLORIDE (POUR BTL) OPTIME
TOPICAL | Status: DC | PRN
  Administered 2016-12-10: 1000 mL

## 2016-12-10 MED ORDER — SUCCINYLCHOLINE CHLORIDE 20 MG/ML IJ SOLN
INTRAMUSCULAR | Status: DC | PRN
  Administered 2016-12-10: 50 mg via INTRAVENOUS

## 2016-12-10 MED ORDER — GLYCOPYRROLATE 0.2 MG/ML IJ SOLN
INTRAMUSCULAR | Status: DC | PRN
  Administered 2016-12-10 (×2): 0.1 mg via INTRAVENOUS

## 2016-12-10 MED ORDER — BUPIVACAINE HCL 0.5 % IJ SOLN
INTRAMUSCULAR | Status: DC | PRN
  Administered 2016-12-10: 20 mL

## 2016-12-10 MED ORDER — FENTANYL CITRATE (PF) 100 MCG/2ML IJ SOLN
INTRAMUSCULAR | Status: DC | PRN
  Administered 2016-12-10: 25 ug via INTRAVENOUS
  Administered 2016-12-10: 100 ug via INTRAVENOUS
  Administered 2016-12-10 (×11): 25 ug via INTRAVENOUS

## 2016-12-10 MED ORDER — PHENYLEPHRINE 40 MCG/ML (10ML) SYRINGE FOR IV PUSH (FOR BLOOD PRESSURE SUPPORT)
PREFILLED_SYRINGE | INTRAVENOUS | Status: AC
  Filled 2016-12-10: qty 10

## 2016-12-10 MED ORDER — DEXAMETHASONE SODIUM PHOSPHATE 10 MG/ML IJ SOLN
INTRAMUSCULAR | Status: AC
  Filled 2016-12-10: qty 1

## 2016-12-10 MED ORDER — FENTANYL CITRATE (PF) 100 MCG/2ML IJ SOLN
INTRAMUSCULAR | Status: AC
  Filled 2016-12-10: qty 2

## 2016-12-10 MED ORDER — BACITRACIN ZINC 500 UNIT/GM EX OINT
TOPICAL_OINTMENT | CUTANEOUS | Status: AC
  Filled 2016-12-10: qty 28.35

## 2016-12-10 MED ORDER — CHLORHEXIDINE GLUCONATE CLOTH 2 % EX PADS: 6.0000 | MEDICATED_PAD | Freq: Once | CUTANEOUS | Status: DC

## 2016-12-10 MED ORDER — DEXAMETHASONE SODIUM PHOSPHATE 4 MG/ML IJ SOLN
INTRAMUSCULAR | Status: DC | PRN
  Administered 2016-12-10: 10 mg via INTRAVENOUS

## 2016-12-10 MED ORDER — FENTANYL CITRATE (PF) 100 MCG/2ML IJ SOLN
25.0000 ug | INTRAMUSCULAR | Status: DC | PRN
  Administered 2016-12-10 (×2): 50 ug via INTRAVENOUS

## 2016-12-10 MED ORDER — SODIUM CHLORIDE 0.9 % IJ SOLN
INTRAMUSCULAR | Status: AC
  Filled 2016-12-10: qty 30

## 2016-12-10 MED ORDER — BUPIVACAINE LIPOSOME 1.3 % IJ SUSP
INTRAMUSCULAR | Status: AC
  Filled 2016-12-10: qty 20

## 2016-12-10 MED ORDER — CEFAZOLIN SODIUM-DEXTROSE 2-4 GM/100ML-% IV SOLN
INTRAVENOUS | Status: AC
  Filled 2016-12-10: qty 100

## 2016-12-10 MED ORDER — NALOXONE HCL 0.4 MG/ML IJ SOLN
INTRAMUSCULAR | Status: AC
  Filled 2016-12-10: qty 1

## 2016-12-10 MED ORDER — FENTANYL CITRATE (PF) 100 MCG/2ML IJ SOLN: 50.0000 ug | INTRAMUSCULAR | Status: DC | PRN

## 2016-12-10 MED ORDER — LIDOCAINE HCL (CARDIAC) 20 MG/ML IV SOLN
INTRAVENOUS | Status: DC | PRN
  Administered 2016-12-10: 30 mg via INTRAVENOUS

## 2016-12-10 MED ORDER — MIDAZOLAM HCL 2 MG/2ML IJ SOLN: 1.0000 mg | INTRAMUSCULAR | Status: DC | PRN

## 2016-12-10 MED ORDER — ONDANSETRON HCL 4 MG/2ML IJ SOLN
INTRAMUSCULAR | Status: AC
  Filled 2016-12-10: qty 2

## 2016-12-10 MED ORDER — LIDOCAINE-EPINEPHRINE 1 %-1:100000 IJ SOLN
INTRAMUSCULAR | Status: DC | PRN
  Administered 2016-12-10: 20 mL

## 2016-12-10 MED ORDER — PROPOFOL 10 MG/ML IV BOLUS
INTRAVENOUS | Status: AC
  Filled 2016-12-10: qty 20

## 2016-12-10 MED ORDER — ONDANSETRON HCL 4 MG/2ML IJ SOLN
INTRAMUSCULAR | Status: DC | PRN
  Administered 2016-12-10: 4 mg via INTRAVENOUS

## 2016-12-10 MED ORDER — MIDAZOLAM HCL 2 MG/2ML IJ SOLN
INTRAMUSCULAR | Status: AC
  Filled 2016-12-10: qty 2

## 2016-12-10 MED ORDER — EPHEDRINE 5 MG/ML INJ
INTRAVENOUS | Status: AC
  Filled 2016-12-10: qty 10

## 2016-12-10 MED ORDER — LACTATED RINGERS IV SOLN
INTRAVENOUS | Status: DC
  Administered 2016-12-10 (×2): via INTRAVENOUS

## 2016-12-10 MED ORDER — CEFAZOLIN SODIUM-DEXTROSE 1-4 GM/50ML-% IV SOLN
INTRAVENOUS | Status: AC
  Filled 2016-12-10: qty 50

## 2016-12-10 MED ORDER — CEFAZOLIN SODIUM-DEXTROSE 1-4 GM/50ML-% IV SOLN: 1.0000 g | Freq: Once | INTRAVENOUS | Status: DC

## 2016-12-10 MED ORDER — PROPOFOL 10 MG/ML IV BOLUS
INTRAVENOUS | Status: DC | PRN
  Administered 2016-12-10: 200 mg via INTRAVENOUS
  Administered 2016-12-10: 50 mg via INTRAVENOUS
  Administered 2016-12-10: 30 mg via INTRAVENOUS

## 2016-12-10 MED ORDER — BUPIVACAINE HCL (PF) 0.5 % IJ SOLN
INTRAMUSCULAR | Status: AC
  Filled 2016-12-10: qty 30

## 2016-12-10 MED ORDER — SCOPOLAMINE 1 MG/3DAYS TD PT72: 1.0000 | MEDICATED_PATCH | Freq: Once | TRANSDERMAL | Status: DC | PRN

## 2016-12-10 MED ORDER — SODIUM CHLORIDE 0.9 % IJ SOLN
INTRAMUSCULAR | Status: DC | PRN
  Administered 2016-12-10: 50 mL via INTRAVENOUS

## 2016-12-10 MED ORDER — CEFAZOLIN SODIUM-DEXTROSE 2-4 GM/100ML-% IV SOLN
2.0000 g | INTRAVENOUS | Status: AC
  Administered 2016-12-10: 2 g via INTRAVENOUS

## 2016-12-10 SURGICAL SUPPLY — 68 items
APL SKNCLS STERI-STRIP NONHPOA (GAUZE/BANDAGES/DRESSINGS) ×2
BAG DECANTER FOR FLEXI CONT (MISCELLANEOUS) ×3 IMPLANT
BENZOIN TINCTURE PRP APPL 2/3 (GAUZE/BANDAGES/DRESSINGS) ×6 IMPLANT
BLADE KNIFE PERSONA 10 (BLADE) ×12 IMPLANT
BLADE KNIFE PERSONA 15 (BLADE) ×9 IMPLANT
BNDG GAUZE ELAST 4 BULKY (GAUZE/BANDAGES/DRESSINGS) ×6 IMPLANT
CANISTER SUCT 1200ML W/VALVE (MISCELLANEOUS) ×5 IMPLANT
CAP BOUFFANT 24 BLUE NURSES (PROTECTIVE WEAR) ×3 IMPLANT
CLOSURE STERI-STRIP 1/2X4 (GAUZE/BANDAGES/DRESSINGS) ×2
CLOSURE WOUND 1/2 X4 (GAUZE/BANDAGES/DRESSINGS) ×4
CLSR STERI-STRIP ANTIMIC 1/2X4 (GAUZE/BANDAGES/DRESSINGS) ×2 IMPLANT
COVER BACK TABLE 60X90IN (DRAPES) ×3 IMPLANT
COVER MAYO STAND STRL (DRAPES) ×3 IMPLANT
DECANTER SPIKE VIAL GLASS SM (MISCELLANEOUS) ×6 IMPLANT
DRAIN CHANNEL 10F 3/8 F FF (DRAIN) ×6 IMPLANT
DRAPE LAPAROSCOPIC ABDOMINAL (DRAPES) ×3 IMPLANT
DRAPE U-SHAPE 76X120 STRL (DRAPES) ×4 IMPLANT
DRSG EMULSION OIL 3X3 NADH (GAUZE/BANDAGES/DRESSINGS) ×6 IMPLANT
DRSG PAD ABDOMINAL 8X10 ST (GAUZE/BANDAGES/DRESSINGS) ×6 IMPLANT
ELECT REM PT RETURN 9FT ADLT (ELECTROSURGICAL) ×3
ELECTRODE REM PT RTRN 9FT ADLT (ELECTROSURGICAL) ×1 IMPLANT
EVACUATOR SILICONE 100CC (DRAIN) ×6 IMPLANT
FILTER 7/8 IN (FILTER) ×2 IMPLANT
GAUZE SPONGE 4X4 12PLY STRL (GAUZE/BANDAGES/DRESSINGS) ×6 IMPLANT
GLOVE BIO SURGEON STRL SZ7 (GLOVE) ×3 IMPLANT
GLOVE BIOGEL PI IND STRL 7.0 (GLOVE) IMPLANT
GLOVE BIOGEL PI INDICATOR 7.0 (GLOVE) ×6
GLOVE ECLIPSE 6.5 STRL STRAW (GLOVE) ×6 IMPLANT
GOWN STRL REUS W/ TWL LRG LVL3 (GOWN DISPOSABLE) ×2 IMPLANT
GOWN STRL REUS W/TWL LRG LVL3 (GOWN DISPOSABLE) ×6
IV NS 250ML (IV SOLUTION) ×3
IV NS 250ML BAXH (IV SOLUTION) ×1 IMPLANT
NDL HYPO 25X1 1.5 SAFETY (NEEDLE) ×3 IMPLANT
NDL SAFETY ECLIPSE 18X1.5 (NEEDLE) ×1 IMPLANT
NDL SPNL 18GX3.5 QUINCKE PK (NEEDLE) ×1 IMPLANT
NEEDLE HYPO 18GX1.5 SHARP (NEEDLE) ×3
NEEDLE HYPO 25X1 1.5 SAFETY (NEEDLE) ×9 IMPLANT
NEEDLE SPNL 18GX3.5 QUINCKE PK (NEEDLE) ×3 IMPLANT
NS IRRIG 1000ML POUR BTL (IV SOLUTION) ×6 IMPLANT
PACK BASIN DAY SURGERY FS (CUSTOM PROCEDURE TRAY) ×3 IMPLANT
PIN SAFETY STERILE (MISCELLANEOUS) ×3 IMPLANT
SCRUB TECHNI CARE 4 OZ NO DYE (MISCELLANEOUS) ×3 IMPLANT
SLEEVE SCD COMPRESS KNEE MED (MISCELLANEOUS) ×3 IMPLANT
SPECIMEN JAR MEDIUM (MISCELLANEOUS) ×6 IMPLANT
SPECIMEN JAR X LARGE (MISCELLANEOUS) IMPLANT
SPONGE LAP 18X18 X RAY DECT (DISPOSABLE) ×11 IMPLANT
STAPLER VISISTAT 35W (STAPLE) ×6 IMPLANT
STRIP CLOSURE SKIN 1/2X4 (GAUZE/BANDAGES/DRESSINGS) ×8 IMPLANT
SUT ETHILON 3 0 PS 1 (SUTURE) ×9 IMPLANT
SUT MNCRL AB 3-0 PS2 18 (SUTURE) ×4 IMPLANT
SUT MNCRL AB 4-0 PS2 18 (SUTURE) ×6 IMPLANT
SUT MON AB 5-0 PS2 18 (SUTURE) ×6 IMPLANT
SUT PROLENE 2 0 CT2 30 (SUTURE) ×3 IMPLANT
SUT PROLENE 3 0 PS 1 (SUTURE) ×6 IMPLANT
SUT VLOC 90 P-14 23 (SUTURE) ×6 IMPLANT
SYR BULB IRRIGATION 50ML (SYRINGE) ×6 IMPLANT
SYR CONTROL 10ML LL (SYRINGE) ×6 IMPLANT
TAPE MEASURE VINYL STERILE (MISCELLANEOUS) ×3 IMPLANT
TOWEL OR 17X24 6PK STRL BLUE (TOWEL DISPOSABLE) ×9 IMPLANT
TOWEL OR NON WOVEN STRL DISP B (DISPOSABLE) IMPLANT
TRAY DSU PREP LF (CUSTOM PROCEDURE TRAY) ×3 IMPLANT
TRAY FOLEY BAG SILVER LF 14FR (SET/KITS/TRAYS/PACK) ×2 IMPLANT
TRAY FOLEY BAG SILVER LF 16FR (SET/KITS/TRAYS/PACK) ×1 IMPLANT
TUBE CONNECTING 20'X1/4 (TUBING) ×1
TUBE CONNECTING 20X1/4 (TUBING) ×2 IMPLANT
UNDERPAD 30X30 (UNDERPADS AND DIAPERS) ×6 IMPLANT
VAC PENCILS W/TUBING CLEAR (MISCELLANEOUS) ×3 IMPLANT
YANKAUER SUCT BULB TIP NO VENT (SUCTIONS) ×3 IMPLANT

## 2016-12-10 NOTE — Discharge Instructions (Signed)
1. No lifting greater than 5 lbs with arms for 4 weeks. 2. Empty, strip, record and reactivate JP drains 3 times a day. 3. Percocet 5/325 mg tabs 1-2 tabs po q 4-6 hours prn pain- prescription given in office. 4. Duricef 1 tab po bid- prescription given in office. 5. Sterapred dose pack as directed- prescription given in office. 6. Follow-up appointment Friday in office.        Post Anesthesia Home Care Instructions  Activity: Get plenty of rest for the remainder of the day. A responsible individual must stay with you for 24 hours following the procedure.  For the next 24 hours, DO NOT: -Drive a car -Paediatric nurse -Drink alcoholic beverages -Take any medication unless instructed by your physician -Make any legal decisions or sign important papers.  Meals: Start with liquid foods such as gelatin or soup. Progress to regular foods as tolerated. Avoid greasy, spicy, heavy foods. If nausea and/or vomiting occur, drink only clear liquids until the nausea and/or vomiting subsides. Call your physician if vomiting continues.  Special Instructions/Symptoms: Your throat may feel dry or sore from the anesthesia or the breathing tube placed in your throat during surgery. If this causes discomfort, gargle with warm salt water. The discomfort should disappear within 24 hours.  If you had a scopolamine patch placed behind your ear for the management of post- operative nausea and/or vomiting:  1. The medication in the patch is effective for 72 hours, after which it should be removed.  Wrap patch in a tissue and discard in the trash. Wash hands thoroughly with soap and water. 2. You may remove the patch earlier than 72 hours if you experience unpleasant side effects which may include dry mouth, dizziness or visual disturbances. 3. Avoid touching the patch. Wash your hands with soap and water after contact with the patch.

## 2016-12-10 NOTE — Transfer of Care (Signed)
Immediate Anesthesia Transfer of Care Note  Patient: Michele Bean  Procedure(s) Performed: MAMMARY REDUCTION  (BREAST) (Bilateral Breast)  Patient Location: PACU  Anesthesia Type:General  Level of Consciousness: sedated and patient cooperative  Airway & Oxygen Therapy: Patient Spontanous Breathing and Patient connected to face mask oxygen  Post-op Assessment: Report given to RN and Post -op Vital signs reviewed and stable  Post vital signs: Reviewed and stable  Last Vitals:  Vitals:   12/10/16 0657  BP: 128/78  Pulse: (!) 55  Temp: 36.6 C  SpO2: 100%    Last Pain:  Vitals:   12/10/16 0657  TempSrc: Oral  PainSc: 0-No pain         Complications: No apparent anesthesia complications

## 2016-12-10 NOTE — Anesthesia Procedure Notes (Signed)
Procedure Name: Intubation Date/Time: 12/10/2016 7:52 AM Performed by: Marrianne Mood Pre-anesthesia Checklist: Patient identified, Emergency Drugs available, Suction available, Patient being monitored and Timeout performed Patient Re-evaluated:Patient Re-evaluated prior to induction Oxygen Delivery Method: Circle system utilized Preoxygenation: Pre-oxygenation with 100% oxygen Induction Type: IV induction Ventilation: Mask ventilation without difficulty Laryngoscope Size: Glidescope and 3 Grade View: Grade III Tube type: Oral Tube size: 7.0 mm Number of attempts: 1 Airway Equipment and Method: Stylet and Oral airway Placement Confirmation: ETT inserted through vocal cords under direct vision,  positive ETCO2 and breath sounds checked- equal and bilateral Secured at: 21 cm Tube secured with: Tape Dental Injury: Teeth and Oropharynx as per pre-operative assessment  Difficulty Due To: Difficulty was anticipated, Difficult Airway- due to limited oral opening and Difficult Airway- due to dentition

## 2016-12-10 NOTE — Anesthesia Postprocedure Evaluation (Signed)
Anesthesia Post Note  Patient: Michele Bean  Procedure(s) Performed: MAMMARY REDUCTION  (BREAST) (Bilateral Breast)     Patient location during evaluation: PACU Anesthesia Type: General Level of consciousness: awake and alert Pain management: pain level controlled Vital Signs Assessment: post-procedure vital signs reviewed and stable Respiratory status: spontaneous breathing, nonlabored ventilation, respiratory function stable and patient connected to nasal cannula oxygen Cardiovascular status: blood pressure returned to baseline and stable Postop Assessment: no apparent nausea or vomiting Anesthetic complications: no    Last Vitals:  Vitals:   12/10/16 1403 12/10/16 1415  BP:  133/63  Pulse: 60   Resp: 15 16  Temp:  36.4 C  SpO2: 96% 95%    Last Pain:  Vitals:   12/10/16 1355  TempSrc:   PainSc: 4                  Meghan Warshawsky EDWARD

## 2016-12-10 NOTE — Op Note (Signed)
OPERATIVE REPORT  12/10/2016  Michele Bean  PREOPERATIVE DIAGNOSIS:  Bilateral macromastia.  POSTOPERATIVE DIAGNOSIS:  Bilateral macromastia.  PROCEDURE:  Bilateral reduction mammoplasties.  ATTENDING SURGEON:  Youlanda Roys, MD  ANESTHESIA:  General.  ANESTHESIOLOGIST:  , MD  COMPLICATIONS:  None.  INDICATIONS FOR THE PROCEDURE:  The patient is a 51 y.o. female who has bilateral macromastia that is clinically symptomatic.  She presents to undergo bilateral reduction mammoplasties.  DESCRIPTION OF PROCEDURE:  The patient was marked in preop holding area in a pattern of Wise for the future bilateral reduction mammoplasties. She was then taken back to the OR, placed on the table in supine position.  After adequate general anesthesia was obtained, the patient's chest was prepped with Techni-Care and draped in sterile fashion.  The bases of the breasts have been infiltrated with 1% lidocaine with epinephrine.  After adequate hemostasis and anesthesia taken effect, the procedure was begun.  Both of the breast reductions were performed in the following similar manner.  The nipple-areolar complex was marked with a 45-mm nipple marker.  The skin was then incised and deepithelialized around the nipple-areolar complex down to the inframammary crease in the inferior pedicle pattern.  Next, the medial, superior, and lateral skin flaps were elevated down to the chest wall.  Excess fat and glandular tissue removed from the inferior pedicle.  The nipple-areolar complex was examined and found to be pink and viable.  The wound was irrigated with saline irrigation.  Meticulous hemostasis was obtained with the Bovie electrocautery.  Inferior pedicle was centralized using 3-0 Prolene suture.  A #10 JP flat fully fluted drain was placed into the wound. The skin flaps were brought together at the inverted T junction with a 2- 0 Prolene suture.  The incisions were stapled for  temporary closure. The breasts compared and found to have good shape and symmetry.  The incisions were then closed from the medial aspect of the JP drain to the medial aspect of the [facility] incision by first placing a few 3-0 Monocryl sutures to tack together the dermal layer, and then both the dermal and cuticular layer were closed in a single layer using a 2-0 V-lock barbed suture.  Lateral to the JP drain incision was closed using 3-0 Monocryl in the dermal layer, followed by 3-0 Monocryl running intracuticular stitch on the skin.  The vertical limb of the Wise pattern was closed in the dermal layer using 3-0 Monocryl suture.  The patient was placed in the upright position.  The future location of the nipple-areolar complexes was marked on both breast mounds using the 45-mm nipple marker.  She was then placed back in the recumbent position.  Both of the nipple areolar complexes were brought out onto the breast mounds in the following similar manner.  The skin was incised as marked and removed in full thickness into the subcutaneous tissues.  The nipple- areolar complex was examined, found to be pink and viable, then brought out through this aperture and sewn in place using 4-0 Monocryl in the dermal layer, followed by 5-0 Monocryl running intracuticular stitch on the skin.  This 5-0 Monocryl suture was then brought down to close the cuticular layer of the vertical limb as well.  The JP drain was sewn in place using 3-0 nylon suture.  The pectoralis major muscle and fascia along with the breast and chest soft tissues were then infiltrated with 1% Exparel (total 266 mg).  Now the [facility] incision was also infiltrated with  the Exparel in order to give the patient postoperative pain control.  The incisions were dressed with benzoin, Steri-Strips, and the nipples dressed with bacitracin ointment and Adaptic.  4x4s were placed over the incisions and ABD pads in the axillary areas.  The patient  was placed into a light postoperative support bra.  There were no complications. The patient tolerated the procedure well.  The final needle, sponge counts were reported to be correct at the end of the case.  The patient was then recovered without complications.  Both the patient and her family were given proper postoperative wound care instructions. She was then discharged home in the care of her family in stable condition.  Follow up will be with me in a few days in the office.         Youlanda Roys, M.D.  12/10/2016 12:24 PM

## 2016-12-10 NOTE — H&P (Signed)
  H&P faxed to surgical center.  -History and Physical Reviewed  -Patient has been re-examined  -No change in the plan of care  CONTOGIANNIS,MARY A    

## 2016-12-10 NOTE — Brief Op Note (Signed)
12/10/2016  12:25 PM  PATIENT:  Thornton Papas  51 y.o. female  PRE-OPERATIVE DIAGNOSIS:  Bilateral Macromastia  POST-OPERATIVE DIAGNOSIS:  Bilateral Macromastia  PROCEDURE:  Procedure(s): MAMMARY REDUCTION  (BREAST) (Bilateral)  SURGEON:  Surgeon(s) and Role:    * Contogiannis, Audrea Muscat, MD - Primary  ANESTHESIA:   general  EBL:  Total I/O In: 3000 [I.V.:3000] Out: 405 [Urine:350; Blood:55]  BLOOD ADMINISTERED:none  DRAINS: (81F) Jackson-Pratt drain(s) with closed bulb suction in the Bilateral Breasts   LOCAL MEDICATIONS USED:  1.3% Exparel (total 266 mgs.)  SPECIMEN:  Source of Specimen:  Bilateral breasts  DISPOSITION OF SPECIMEN:  PATHOLOGY  COUNTS:  YES  DICTATION: .Note written in EPIC  PLAN OF CARE: Discharge to home after PACU  PATIENT DISPOSITION:  PACU - hemodynamically stable.   Delay start of Pharmacological VTE agent (>24hrs) due to surgical blood loss or risk of bleeding: not applicable

## 2016-12-11 ENCOUNTER — Encounter (HOSPITAL_BASED_OUTPATIENT_CLINIC_OR_DEPARTMENT_OTHER): Payer: Self-pay | Admitting: Plastic Surgery

## 2017-10-20 DIAGNOSIS — E210 Primary hyperparathyroidism: Secondary | ICD-10-CM | POA: Insufficient documentation

## 2017-10-27 ENCOUNTER — Other Ambulatory Visit: Payer: Self-pay | Admitting: Obstetrics and Gynecology

## 2017-10-27 DIAGNOSIS — Z1231 Encounter for screening mammogram for malignant neoplasm of breast: Secondary | ICD-10-CM

## 2017-10-28 ENCOUNTER — Other Ambulatory Visit: Payer: Self-pay | Admitting: Endocrinology

## 2017-10-28 DIAGNOSIS — E213 Hyperparathyroidism, unspecified: Secondary | ICD-10-CM

## 2017-11-28 ENCOUNTER — Ambulatory Visit
Admission: RE | Admit: 2017-11-28 | Discharge: 2017-11-28 | Disposition: A | Discharge status: Home / Self Care | Payer: Managed Care, Other (non HMO) | Source: Ambulatory Visit | Attending: Obstetrics and Gynecology | Admitting: Obstetrics and Gynecology

## 2017-11-28 DIAGNOSIS — Z1231 Encounter for screening mammogram for malignant neoplasm of breast: Secondary | ICD-10-CM

## 2017-12-18 ENCOUNTER — Other Ambulatory Visit: Payer: BLUE CROSS/BLUE SHIELD

## 2017-12-26 ENCOUNTER — Ambulatory Visit
Admission: RE | Admit: 2017-12-26 | Discharge: 2017-12-26 | Disposition: A | Discharge status: Home / Self Care | Payer: Managed Care, Other (non HMO) | Source: Ambulatory Visit | Attending: Endocrinology | Admitting: Endocrinology

## 2017-12-26 DIAGNOSIS — E213 Hyperparathyroidism, unspecified: Secondary | ICD-10-CM

## 2018-02-09 ENCOUNTER — Other Ambulatory Visit: Payer: Self-pay | Admitting: Family Medicine

## 2018-02-09 DIAGNOSIS — N6320 Unspecified lump in the left breast, unspecified quadrant: Principal | ICD-10-CM

## 2018-02-09 DIAGNOSIS — N6325 Unspecified lump in the left breast, overlapping quadrants: Secondary | ICD-10-CM

## 2018-02-13 ENCOUNTER — Ambulatory Visit
Admission: RE | Admit: 2018-02-13 | Discharge: 2018-02-13 | Disposition: A | Discharge status: Home / Self Care | Payer: Managed Care, Other (non HMO) | Source: Ambulatory Visit | Attending: Family Medicine | Admitting: Family Medicine

## 2018-02-13 DIAGNOSIS — N6325 Unspecified lump in the left breast, overlapping quadrants: Secondary | ICD-10-CM

## 2018-02-13 DIAGNOSIS — N6320 Unspecified lump in the left breast, unspecified quadrant: Principal | ICD-10-CM

## 2018-05-27 ENCOUNTER — Encounter: Payer: Self-pay | Admitting: Gastroenterology

## 2018-07-16 ENCOUNTER — Telehealth: Payer: Self-pay | Admitting: *Deleted

## 2018-07-16 NOTE — Telephone Encounter (Signed)
Spoke with pt. She is not going to do a video visit. She would rather r/s for a time when she can come into office. I advised we will call her back.

## 2018-07-19 NOTE — Telephone Encounter (Signed)
That is fine, would you like to schedule her or should I discuss with new referrals? thanks

## 2018-07-20 ENCOUNTER — Institutional Professional Consult (permissible substitution): Payer: Managed Care, Other (non HMO) | Admitting: Neurology

## 2018-07-20 NOTE — Telephone Encounter (Signed)
Note Michele Bean scheduled pt for in-office visit in June.

## 2018-08-20 ENCOUNTER — Institutional Professional Consult (permissible substitution): Payer: Managed Care, Other (non HMO) | Admitting: Neurology

## 2018-08-21 ENCOUNTER — Other Ambulatory Visit: Payer: Self-pay | Admitting: Gastroenterology

## 2018-08-31 ENCOUNTER — Encounter (HOSPITAL_COMMUNITY): Admission: RE | Payer: Self-pay | Source: Home / Self Care

## 2018-08-31 ENCOUNTER — Ambulatory Visit (HOSPITAL_COMMUNITY)
Admission: RE | Admit: 2018-08-31 | Payer: Managed Care, Other (non HMO) | Source: Home / Self Care | Admitting: Gastroenterology

## 2018-08-31 SURGERY — MANOMETRY, ESOPHAGUS

## 2018-09-07 ENCOUNTER — Encounter: Payer: Self-pay | Admitting: Podiatry

## 2018-09-07 ENCOUNTER — Other Ambulatory Visit: Payer: Self-pay | Admitting: Podiatry

## 2018-09-07 ENCOUNTER — Ambulatory Visit (INDEPENDENT_AMBULATORY_CARE_PROVIDER_SITE_OTHER): Payer: Managed Care, Other (non HMO)

## 2018-09-07 ENCOUNTER — Other Ambulatory Visit: Payer: Self-pay

## 2018-09-07 ENCOUNTER — Ambulatory Visit (INDEPENDENT_AMBULATORY_CARE_PROVIDER_SITE_OTHER): Payer: Managed Care, Other (non HMO) | Admitting: Podiatry

## 2018-09-07 VITALS — Temp 97.6°F

## 2018-09-07 DIAGNOSIS — M722 Plantar fascial fibromatosis: Secondary | ICD-10-CM

## 2018-09-07 DIAGNOSIS — M79671 Pain in right foot: Secondary | ICD-10-CM

## 2018-09-07 DIAGNOSIS — M79672 Pain in left foot: Secondary | ICD-10-CM

## 2018-09-07 DIAGNOSIS — G5752 Tarsal tunnel syndrome, left lower limb: Secondary | ICD-10-CM | POA: Diagnosis not present

## 2018-09-07 DIAGNOSIS — R109 Unspecified abdominal pain: Secondary | ICD-10-CM | POA: Insufficient documentation

## 2018-09-07 NOTE — Patient Instructions (Signed)
Plantar Fasciitis Rehab Ask your health care provider which exercises are safe for you. Do exercises exactly as told by your health care provider and adjust them as directed. It is normal to feel mild stretching, pulling, tightness, or discomfort as you do these exercises. Stop right away if you feel sudden pain or your pain gets worse. Do not begin these exercises until told by your health care provider. Stretching and range-of-motion exercises These exercises warm up your muscles and joints and improve the movement and flexibility of your foot. These exercises also help to relieve pain. Plantar fascia stretch  1. Sit with your left / right leg crossed over your opposite knee. 2. Hold your heel with one hand with that thumb near your arch. With your other hand, hold your toes and gently pull them back toward the top of your foot. You should feel a stretch on the bottom of your toes or your foot (plantar fascia) or both. 3. Hold this stretch for__________ seconds. 4. Slowly release your toes and return to the starting position. Repeat __________ times. Complete this exercise __________ times a day. Gastrocnemius stretch, standing This exercise is also called a calf (gastroc) stretch. It stretches the muscles in the back of the upper calf. 1. Stand with your hands against a wall. 2. Extend your left / right leg behind you, and bend your front knee slightly. 3. Keeping your heels on the floor and your back knee straight, shift your weight toward the wall. Do not arch your back. You should feel a gentle stretch in your upper left / right calf. 4. Hold this position for __________ seconds. Repeat __________ times. Complete this exercise __________ times a day. Soleus stretch, standing This exercise is also called a calf (soleus) stretch. It stretches the muscles in the back of the lower calf. 1. Stand with your hands against a wall. 2. Extend your left / right leg behind you, and bend your front  knee slightly. 3. Keeping your heels on the floor, bend your back knee and shift your weight slightly over your back leg. You should feel a gentle stretch deep in your lower calf. 4. Hold this position for __________ seconds. Repeat __________ times. Complete this exercise __________ times a day. Gastroc and soleus stretch, standing step This exercise stretches the muscles in the back of the lower leg. These muscles are in the upper calf (gastrocnemius) and the lower calf (soleus). 1. Stand with the ball of your left / right foot on a step. The ball of your foot is on the walking surface, right under your toes. 2. Keep your other foot firmly on the same step. 3. Hold on to the wall or a railing for balance. 4. Slowly lift your other foot, allowing your body weight to press your left / right heel down over the edge of the step. You should feel a stretch in your left / right calf. 5. Hold this position for __________ seconds. 6. Return both feet to the step. 7. Repeat this exercise with a slight bend in your left / right knee. Repeat __________ times with your left / right knee straight and __________ times with your left / right knee bent. Complete this exercise __________ times a day. Balance exercise This exercise builds your balance and strength control of your arch to help take pressure off your plantar fascia. Single leg stand If this exercise is too easy, you can try it with your eyes closed or while standing on a pillow. 1.   Without shoes, stand near a railing or in a doorway. You may hold on to the railing or door frame as needed. 2. Stand on your left / right foot. Keep your big toe down on the floor and try to keep your arch lifted. Do not let your foot roll inward. 3. Hold this position for __________ seconds. Repeat __________ times. Complete this exercise __________ times a day. This information is not intended to replace advice given to you by your health care provider. Make sure  you discuss any questions you have with your health care provider. Document Released: 02/25/2005 Document Revised: 06/18/2018 Document Reviewed: 12/24/2017 Elsevier Patient Education  2020 Elsevier Inc.  

## 2018-09-07 NOTE — Progress Notes (Signed)
Subjective:   Patient ID: Michele Bean, female   DOB: 53 y.o.   MRN: [id number]   HPI Patient states she really has had chronic pain in her arch left over right since she was seen almost 2 years ago.  States it was some better but it is really intensified over the last 6 months and really gives her problems with ambulation and she knows that she is getting need orthotics F2   ROS      Objective:  Physical Exam  Neurovascular status was found to be intact muscle strength adequate patient's left arch found to be inflamed in the mid arch area with pain upon deep palpation to the tissue.  Patient is found to have good digital perfusion     Assessment:  Mid arch plantar fasciitis left over right with moderate flatfoot deformity is complicating factor     Plan:  H&P x-ray and condition reviewed and today I did careful mid arch injection 3 mg Kenalog 5 mg Xylocaine and applied fascial brace.  We then went ahead and casted for functional orthotics to support the arch and take stress off the arch and patient will be seen back to recheck and will use night splint that she has at home along with heat ice therapy  X-rays indicated depression of the arch bilateral with small spur formation no indication of stress fracture arthritis

## 2018-09-17 ENCOUNTER — Other Ambulatory Visit: Payer: Managed Care, Other (non HMO) | Admitting: Orthotics

## 2018-10-08 ENCOUNTER — Other Ambulatory Visit: Payer: Self-pay

## 2018-10-08 ENCOUNTER — Ambulatory Visit: Payer: Managed Care, Other (non HMO) | Admitting: Orthotics

## 2018-10-08 VITALS — Temp 97.2°F

## 2018-10-08 DIAGNOSIS — M79672 Pain in left foot: Secondary | ICD-10-CM

## 2018-10-08 DIAGNOSIS — M722 Plantar fascial fibromatosis: Secondary | ICD-10-CM

## 2018-10-08 NOTE — Progress Notes (Signed)
Patient presents with history of PTTD/Pes Planus (acquired).  Patient demonstrated medial shift talus/drop navicular, and medial column collapse.   Plan is for CMFO w/ longitudinal arch support, rear foot stability to address eversion.  Fabrication will be with semi rigid/rigid poly pro shell, deep heel seat, wide, padding under spenco cover. Plan on                                      To fabricate. 

## 2018-11-02 ENCOUNTER — Ambulatory Visit: Payer: Managed Care, Other (non HMO) | Admitting: Orthotics

## 2018-11-02 ENCOUNTER — Other Ambulatory Visit: Payer: Self-pay

## 2018-11-02 DIAGNOSIS — G5752 Tarsal tunnel syndrome, left lower limb: Secondary | ICD-10-CM

## 2018-11-02 DIAGNOSIS — M722 Plantar fascial fibromatosis: Secondary | ICD-10-CM

## 2018-11-02 NOTE — Progress Notes (Signed)
Patient came in today to pick up custom made foot orthotics.  The goals were accomplished and the patient reported no dissatisfaction with said orthotics.  Patient was advised of breakin period and how to report any issues. 

## 2018-11-26 ENCOUNTER — Other Ambulatory Visit: Payer: Self-pay

## 2018-11-26 ENCOUNTER — Ambulatory Visit: Payer: Managed Care, Other (non HMO) | Admitting: Orthotics

## 2018-11-26 NOTE — Progress Notes (Signed)
Patient left w/o being seen.

## 2019-01-08 ENCOUNTER — Other Ambulatory Visit: Payer: Self-pay | Admitting: Family Medicine

## 2019-01-08 DIAGNOSIS — Z1231 Encounter for screening mammogram for malignant neoplasm of breast: Secondary | ICD-10-CM

## 2019-01-28 ENCOUNTER — Ambulatory Visit: Payer: Managed Care, Other (non HMO) | Admitting: Orthotics

## 2019-01-28 ENCOUNTER — Other Ambulatory Visit: Payer: Self-pay

## 2019-01-28 DIAGNOSIS — M722 Plantar fascial fibromatosis: Secondary | ICD-10-CM

## 2019-01-28 NOTE — Progress Notes (Signed)
ADJUSTED F/O

## 2019-02-26 ENCOUNTER — Other Ambulatory Visit: Payer: Self-pay

## 2019-02-26 ENCOUNTER — Ambulatory Visit: Payer: Managed Care, Other (non HMO)

## 2019-02-26 ENCOUNTER — Ambulatory Visit
Admission: RE | Admit: 2019-02-26 | Discharge: 2019-02-26 | Disposition: A | Discharge status: Home / Self Care | Payer: Managed Care, Other (non HMO) | Source: Ambulatory Visit | Attending: Family Medicine | Admitting: Family Medicine

## 2019-02-26 DIAGNOSIS — Z1231 Encounter for screening mammogram for malignant neoplasm of breast: Secondary | ICD-10-CM

## 2020-01-25 ENCOUNTER — Other Ambulatory Visit: Payer: Self-pay | Admitting: Family Medicine

## 2020-01-25 DIAGNOSIS — Z1231 Encounter for screening mammogram for malignant neoplasm of breast: Secondary | ICD-10-CM

## 2020-03-07 ENCOUNTER — Ambulatory Visit
Admission: RE | Admit: 2020-03-07 | Discharge: 2020-03-07 | Disposition: A | Discharge status: Home / Self Care | Payer: Managed Care, Other (non HMO) | Source: Ambulatory Visit | Attending: Family Medicine | Admitting: Family Medicine

## 2020-03-07 ENCOUNTER — Other Ambulatory Visit: Payer: Self-pay

## 2020-03-07 DIAGNOSIS — Z1231 Encounter for screening mammogram for malignant neoplasm of breast: Secondary | ICD-10-CM

## 2020-05-25 ENCOUNTER — Ambulatory Visit: Payer: Managed Care, Other (non HMO) | Admitting: Podiatry

## 2020-06-05 ENCOUNTER — Telehealth: Payer: Self-pay | Admitting: Neurology

## 2020-06-05 NOTE — Telephone Encounter (Signed)
That's fine with me, thanks.

## 2020-06-05 NOTE — Telephone Encounter (Signed)
This patient is requesting a provider switch from Dr. Krista Blue to Dr. Jaynee Eagles. She is being referred for possible trigeminal neuralgia. She was previously seen by Dr. Krista Blue for this in 2018 but looks like she never followed up. Please advise if this is acceptable. Thank you

## 2020-06-06 NOTE — Telephone Encounter (Signed)
Ok with me 

## 2020-07-14 ENCOUNTER — Other Ambulatory Visit: Payer: Self-pay

## 2020-07-14 ENCOUNTER — Encounter: Payer: Self-pay | Admitting: Podiatry

## 2020-07-14 ENCOUNTER — Ambulatory Visit: Payer: Managed Care, Other (non HMO) | Admitting: Podiatry

## 2020-07-14 DIAGNOSIS — L600 Ingrowing nail: Secondary | ICD-10-CM | POA: Diagnosis not present

## 2020-07-14 DIAGNOSIS — M722 Plantar fascial fibromatosis: Secondary | ICD-10-CM | POA: Diagnosis not present

## 2020-07-14 NOTE — Progress Notes (Signed)
Subjective:   Patient ID: Michele Bean, female   DOB: 55 y.o.   MRN: [id number]   HPI Patient states she developed a lot of pain in the bottom of her left heel and also she has an ingrown toenail of the left big toe that is been sore and she cannot get herself.  Has tried to soak treatment without relief   ROS      Objective:  Physical Exam  Neurovascular status intact with incurvation of the left hallux medial border with previous nail removal with a painful spicule which is formed over the last several years.  It is irritated no drainage no redness noted.  Patient also has intense discomfort plantar fascial band at insertion to the calcaneus and has had to change the type of shoes she wears to work     Assessment:  Chronic ingrown toenail deformity left hallux #1 #2 plantar fasciitis with acute inflammation left     Plan:  H&P reviewed both conditions.  At this point for the ingrown I recommended correction allowed her to read consent form reviewed consent form.  I went ahead today and I infiltrated the left hallux 60 mg Xylocaine Marcaine mixture sterile prep done and using sterile instrumentation I remove the medial border exposed matrix applied phenol 3 applications 30 seconds followed by alcohol lavage and sterile dressing.  Gave instructions on soaks and to leave dressing on 24 hours but take it off earlier if it should start to throb and encouraged her to call with questions.  I then went ahead did sterile prep and injected the plantar fascial left 3 mg Kenalog 5 mg Xylocaine and applied sterile dressing.  X-rays indicate flat type arch with moderate arthritis around the midtarsal joint small spur formation heel

## 2020-07-14 NOTE — Patient Instructions (Signed)

## 2020-07-18 ENCOUNTER — Encounter: Payer: Self-pay | Admitting: Neurology

## 2020-07-18 ENCOUNTER — Ambulatory Visit: Payer: BC Managed Care – PPO | Admitting: Neurology

## 2020-07-18 VITALS — BP 159/87 | HR 51 | Ht 64.0 in | Wt 244.0 lb

## 2020-07-18 DIAGNOSIS — R519 Headache, unspecified: Secondary | ICD-10-CM

## 2020-07-18 NOTE — Patient Instructions (Signed)
MRI Face/Trigeminal Protocol Medical management or procedures on the nerve to make it less sensitive   Trigeminal Neuralgia  Trigeminal neuralgia is a nerve disorder that causes severe pain on one side of the face. The pain may last from a few seconds to several minutes. The pain is usually only on one side of the face. Symptoms may occur for days, weeks, or months and then go away for months or years. The pain may return and be worse than before. What are the causes? This condition is caused by damage or pressure to a nerve in the head that is called the trigeminal nerve. An attack can be triggered by:  Talking.  Chewing.  Putting on makeup.  Washing your face.  Shaving your face.  Brushing your teeth.  Touching your face. What increases the risk? You are more likely to develop this condition if you:  Are 53 years of age or older.  Are female. What are the signs or symptoms? The main symptom of this condition is severe pain in the:  Jaw.  Lips.  Eyes.  Nose.  Scalp.  Forehead.  Face. The pain may be:  Intense.  Stabbing.  Electric.  Shock-like. How is this diagnosed? This condition is diagnosed with a physical exam. A CT scan or an MRI may be done to rule out other conditions that can cause facial pain. How is this treated? This condition may be treated with:  Avoiding the things that trigger your symptoms.  Taking prescription medicines (anticonvulsants).  Having surgery. This may be done in severe cases if other medical treatment does not provide relief.  Having procedures such as ablation, thermal, or radiation therapy. It may take up to one month for treatment to start relieving the pain. Follow these instructions at home: Managing pain  Learn as much as you can about how to manage your pain. Ask your health care provider if a pain specialist would be helpful.  Consider talking with a mental health care provider (psychologist) about how to  cope with the pain.  Consider joining a pain support group. General instructions  Take over-the-counter and prescription medicines only as told by your health care provider.  Avoid the things that trigger your symptoms. It may help to: ? Chew on the unaffected side of your mouth. ? Avoid touching your face. ? Avoid blasts of hot or cold air.  Follow your treatment plan as told by your health care provider. This may include: ? Cognitive or behavioral therapy. ? Gentle, regular exercise. ? Meditation or yoga. ? Aromatherapy.  Keep all follow-up visits as told by your health care provider. You may need to be monitored closely to make sure treatment is working well for you. Where to find more information  Facial Pain Association: fpa-support.org Contact a health care provider if:  Your medicine is not helping your symptoms.  You have side effects from the medicine used for treatment.  You develop new, unexplained symptoms, such as: ? Double vision. ? Facial weakness. ? Facial numbness. ? Changes in hearing or balance.  You feel depressed. Get help right away if:  Your pain is severe and is not getting better.  You develop suicidal thoughts. If you ever feel like you may hurt yourself or others, or have thoughts about taking your own life, get help right away. You can go to your nearest emergency department or call:  Your local emergency services (911 in the [place]  A suicide crisis helpline, such as the [place] Suicide  Prevention Lifeline at 830-279-8589. This is open 24 hours a day. Summary  Trigeminal neuralgia is a nerve disorder that causes severe pain on one side of the face. The pain may last from a few seconds to several minutes.  This condition is caused by damage or pressure to a nerve in the head that is called the trigeminal nerve.  Treatment may include avoiding the things that trigger your symptoms, taking medicines, or having surgery or procedures. It  may take up to one month for treatment to start relieving the pain.  Avoid the things that trigger your symptoms.  Keep all follow-up visits as told by your health care provider. You may need to be monitored closely to make sure treatment is working well for you. This information is not intended to replace advice given to you by your health care provider. Make sure you discuss any questions you have with your health care provider. Document Revised: 01/12/2018 Document Reviewed: 01/12/2018 Elsevier Patient Education  2021 [employer].

## 2020-07-18 NOTE — Progress Notes (Signed)
[facility]    Provider:  Dr Jaynee Eagles Requesting Provider: Beverely Low, D* Primary Care Provider:  Aretta Nip, MD  CC:  Facial pain  HPI:  Michele Bean is a 55 y.o. female here as requested by Beverely Low, D* for possible trigeminal neuralgia.  Per Dr. Salome Arnt they have ruled out any odontogenic cause for her symptoms.  She has a past medical history of lower back pain seeing Dr. Vertell Limber, anemia, iron deficiency, beta thalassemia trait, joint pains, rheumatologic evaluation is negative, esophageal reflux, chronic urticaria, chronic sinus problems, history of migraine headaches, mild obesity, hypertension, Hashimoto hypothyroid, vitamin D deficiency, chronic alopecia.  She had a few hairline fractures in her mouth, she had some cavities on the inside, they went to fill them some years ago and she started having root canals, hairline fractures on lower jaw back teeth bilaterally, she has root canal on 4 total teeth on the left with 2 of them having to be removed, that is when the pain in the jaw started, she had on ein 2006 and 2007, she also has chronic sinus disease, with maxillary mucus retension cyst and thickening in the bilateral maxilla sinuses (reviewed Ct Face/sinuses report). Everything has been done on the left. She has teeth issues on the right and had one root canal on the right. In one tooth they root canaled on the left and gave her a temporary crown she still feels something that comes and goes, justin the localized area where the one tooth resides(she thinks it is tooth 21(she points to the lower jawright at the jaw crease lower left lip, feelings are on and off all day, very localized, not numbness, not tingling, not shooting, just a pain that is there, does not shoot from the ear to the face or jaw, it Is painful but mild, 2-3/10 in pain. Very localized spot under the left lower tooth. Always there, not made worse by chewing or brushing or  moving the jaw.   Reviewed notes, labs and imaging from outside physicians, which showed:  TSH 2.19.  CBC with mild anemia appears microcytic, CMP with BUN 14 and creatinine 0.84 otherwise normal collection date May 08, 2020 Review of Systems: Patient complains of symptoms per HPI as well as the following symptoms: facial pain. Pertinent negatives and positives per HPI. All others negative.   Social History   Socioeconomic History  . Marital status: Single    Spouse name: Not on file  . Number of children: 0  . Years of education: 3 years college  . Highest education level: Not on file  Occupational History  . Occupation: Customer Support Agent/Billing  Tobacco Use  . Smoking status: Never Smoker  . Smokeless tobacco: Never Used  Vaping Use  . Vaping Use: Never used  Substance and Sexual Activity  . Alcohol use: No  . Drug use: No  . Sexual activity: Never    Birth control/protection: None, Surgical  Other Topics Concern  . Not on file  Social History Narrative   Lives at home with her mother.   Right-handed.   Occasional caffeine use - twice weekly.   Social Determinants of Health   Financial Resource Strain: Not on file  Food Insecurity: Not on file  Transportation Needs: Not on file  Physical Activity: Not on file  Stress: Not on file  Social Connections: Not on file  Intimate Partner Violence: Not on file    Family History  Problem Relation Age of Onset  .  Obesity Father   . Thyroid disease Father   . COPD Father   . Atrial fibrillation Father   . Hypertension Father   . Colon polyps Father   . Thyroid disease Maternal Aunt   . Diabetes Maternal Aunt   . Healthy Mother     Past Medical History:  Diagnosis Date  . Abnormal Pap smear   . Alopecia    chronic  . Anemia   . Beta thalassemia trait    workup in 2000 with hemoglobin electrophoresis, hemoglobin in the range of 10.  . Chronic urticaria    seen by dermatologist at [facility],  antihistamine treatment 2/11  . Fibroid   . GERD (gastroesophageal reflux disease)   . H/O low back pain   . Headache(784.0)    OTC meds PRN  . History of migraine   . Hx of menorrhagia   . Hypertension   . Hypothyroidism    hashimoto   . Lower back pain    tx with naprosyn  . Macromastia   . Renal cyst    renal cysts- CT 07/30/12- urology evaluation, Dr Gaynelle Arabian  . Seasonal allergies   . Sinus problem    chronic  . Thyroid dysfunction   . Trigeminal neuralgia   . Vitamin D deficiency   . Yeast vaginitis 2010    Patient Active Problem List   Diagnosis Date Noted  . Abdominal pain 09/07/2018  . Hypercalcemia 10/20/2017  . Primary hyperparathyroidism ([place]) 10/20/2017  . Left facial pain 07/22/2016  . Essential hypertension 06/19/2016  . Sinus bradycardia 06/19/2016  . Thalassemia 06/19/2016  . 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 Skin inflammation 09/06/2013  . Allergic rhinitis 02/11/2013  . Hypertrophy of inferior nasal turbinate 02/11/2013  . Morbid obesity ([place]) 10/29/2012  . Scalp pruritus 07/20/2012  . Dermatofibroma 05/06/2012  . Fibroid   . Hx of menorrhagia   . Thyroid dysfunction   . H/O low back pain   . Laryngopharyngeal reflux 05/03/2011  . Acute sinusitis 02/06/2011  . Chronic sinusitis 02/06/2011    Past Surgical History:  Procedure Laterality Date  . ABDOMINAL HYSTERECTOMY  10/17/2011   Procedure: HYSTERECTOMY ABDOMINAL;  Surgeon: Ena Dawley, MD;  Location: [facility];  Service: Gynecology;  Laterality: N/A;  converted to abdominal 1326  . arthroscopic knee surgery     left  . BREAST REDUCTION SURGERY Bilateral 12/10/2016   Procedure: MAMMARY REDUCTION  (BREAST);  Surgeon: Contogiannis, Audrea Muscat, MD;  Location: [facility];  Service: Plastics;  Laterality: Bilateral;  . FOOT SURGERY  1999   bilateral - hammer toes  . LAPAROSCOPIC ASSISTED VAGINAL HYSTERECTOMY  10/17/2011   Procedure: LAPAROSCOPIC ASSISTED VAGINAL  HYSTERECTOMY;  Surgeon: Ena Dawley, MD;  Location: [facility];  Service: Gynecology;  Laterality: N/A;  Possible TAH, 3 hours   . MYOMECTOMY  2000  . NASAL SINUS SURGERY  2006, 2007   x 2  . REDUCTION MAMMAPLASTY Bilateral 12/10/2016  . ROOT CANAL    . WISDOM TOOTH EXTRACTION      Current Outpatient Medications  Medication Sig Dispense Refill  . clobetasol ointment (TEMOVATE) 0.05 % Apply to affected areas of scalp 3-5 times weekly    . fexofenadine (ALLEGRA) 180 MG tablet Take 180 mg by mouth daily.     . fluocinonide ointment (LIDEX) 0.05 % Apply once a day to affected areas on the scalp    . hydrochlorothiazide (MICROZIDE) 12.5 MG capsule Take 12.5 mg by mouth daily.     Marland Kitchen  levothyroxine (SYNTHROID, LEVOTHROID) 50 MCG tablet Take 50 mcg by mouth daily before breakfast.     . omeprazole (PRILOSEC) 40 MG capsule Take by mouth.     No current facility-administered medications for this visit.    Allergies as of 07/18/2020 - Review Complete 07/18/2020  Allergen Reaction Noted  . Tetanus toxoid adsorbed Rash 04/24/2015    Vitals: BP (!) 159/87 (BP Location: Left Arm, Patient Position: Sitting, Cuff Size: Large)   Pulse (!) 51   Ht 5\' 4"  (1.626 m)   Wt 244 lb (110.7 kg)   LMP 09/25/2011   BMI 41.88 kg/m  Last Weight:  Wt Readings from Last 1 Encounters:  07/18/20 244 lb (110.7 kg)   Last Height:   Ht Readings from Last 1 Encounters:  07/18/20 5\' 4"  (1.626 m)     Physical exam: Exam: Gen: NAD, conversant, well nourised, obese, well groomed                     CV: RRR, no MRG. No Carotid Bruits. No peripheral edema, warm, nontender Eyes: Conjunctivae clear without exudates or hemorrhage  Neuro: Detailed Neurologic Exam  Speech:    Speech is normal; fluent and spontaneous with normal comprehension.  Cognition:    The patient is oriented to person, place, and time;     recent and remote memory intact;     language fluent;     normal attention, concentration,      fund of knowledge Cranial Nerves:    The pupils are equal, round, and reactive to light. The fundi are FLAT. Visual fields are full to finger confrontation. Extraocular movements are intact. Trigeminal sensation is intact and the muscles of mastication are normal. The face is symmetric. The palate elevates in the midline. Hearing intact. Voice is normal. Shoulder shrug is normal. The tongue has normal motion without fasciculations.   Coordination:    Normal finger to nose   Gait:    Normal native gait  Motor Observation:    No asymmetry, no atrophy, and no involuntary movements noted. Tone:    Normal muscle tone.    Posture:    Posture is normal. normal erect    Strength:    Strength is V/V in the upper and lower limbs.      Sensation: intact to LT     Reflex Exam:  DTR's:    Deep tendon reflexes in the upper and lower extremities are normal bilaterally.   Toes:    The toes are downgoing bilaterally.   Clonus:    Clonus is absent.    Assessment/Plan: This is a very nice 55 year old patient who is here for very localized pain in the area the size of a dime to a quarter size right below the right corner of her mouth at the base of one of her lower jaw teeth.  The patient's symptoms started after dental problems with that tooth.  She does not fit the ichd3 clinical criteria for Trigeminal Neuralgia which is a specific neurologic syndrome, but considering the trigeminal nerve does provide sensation to the face it follows that there is some sort of very localized trigeminal nerve irritation or injury.  I explained to patient we could get an MRI of the head/face with trigeminal protocol to see if there is anything that we can find to address however in the absence of that its medical management or possibly procedures on the trigeminal nerve such as radiofrequency ablation to lessen the sensation.  I  cannot comment on whether her dental problems were what irritated the trigeminal nerve in  that area in the first place but does seem to be localized pain right underneath one of her teeth. She states it is not bad enough to take medication for as far as severity.  MRI with trigeminal protocol.    MRI open MRI   Orders Placed This Encounter  Procedures  . MR FACE/TRIGEMINAL WO/W CM   No orders of the defined types were placed in this encounter.   Cc: Beverely Low, D*,  Rankins, Bill Salinas, MD  Sarina Ill, MD  [facility] [address] [place], [place] [address]  Phone [phone] Fax [phone]

## 2020-07-19 ENCOUNTER — Telehealth: Payer: Self-pay | Admitting: Neurology

## 2020-07-19 NOTE — Telephone Encounter (Signed)
Michele Bean: [id number] (exp. 07/19/20 to 01/14/21 order sent to [facility]. They will reach out to the patient to schedule.

## 2020-07-27 NOTE — Telephone Encounter (Signed)
Pt is asking for an open MRI, she would like orders sent to [facility], please call pt to discuss

## 2020-07-31 NOTE — Telephone Encounter (Addendum)
I called [employer] @ [phone] and spoke with Doroteo Bradford to change site location. She was able to change location to [facility] so patient can have an open MRI. Faxed order to [facility] @ [facility].

## 2020-07-31 NOTE — Telephone Encounter (Signed)
I called patient to let her know that I will change the site location on the authorization to [facility].

## 2020-08-03 NOTE — Telephone Encounter (Signed)
Per Larene Beach at [facility], she reached out to the patient and she does not want to schedule MRI at this time.

## 2021-01-22 ENCOUNTER — Other Ambulatory Visit: Payer: Self-pay | Admitting: Family Medicine

## 2021-01-22 DIAGNOSIS — Z1231 Encounter for screening mammogram for malignant neoplasm of breast: Secondary | ICD-10-CM

## 2021-02-12 ENCOUNTER — Other Ambulatory Visit: Payer: Self-pay | Admitting: Obstetrics & Gynecology

## 2021-02-12 DIAGNOSIS — N6310 Unspecified lump in the right breast, unspecified quadrant: Secondary | ICD-10-CM

## 2021-03-08 ENCOUNTER — Ambulatory Visit: Payer: Managed Care, Other (non HMO)

## 2021-03-15 ENCOUNTER — Ambulatory Visit: Payer: Managed Care, Other (non HMO)

## 2021-03-27 ENCOUNTER — Ambulatory Visit
Admission: RE | Admit: 2021-03-27 | Discharge: 2021-03-27 | Disposition: A | Discharge status: Home / Self Care | Payer: Managed Care, Other (non HMO) | Source: Ambulatory Visit | Attending: Obstetrics & Gynecology | Admitting: Obstetrics & Gynecology

## 2021-03-27 ENCOUNTER — Ambulatory Visit
Admission: RE | Admit: 2021-03-27 | Discharge: 2021-03-27 | Disposition: A | Discharge status: Home / Self Care | Payer: BC Managed Care – PPO | Source: Ambulatory Visit | Attending: Obstetrics & Gynecology | Admitting: Obstetrics & Gynecology

## 2021-03-27 DIAGNOSIS — N6310 Unspecified lump in the right breast, unspecified quadrant: Secondary | ICD-10-CM

## 2021-06-14 ENCOUNTER — Encounter: Payer: Self-pay | Admitting: Podiatry

## 2021-06-14 ENCOUNTER — Ambulatory Visit (INDEPENDENT_AMBULATORY_CARE_PROVIDER_SITE_OTHER): Payer: BC Managed Care – PPO

## 2021-06-14 ENCOUNTER — Ambulatory Visit: Payer: BC Managed Care – PPO | Admitting: Podiatry

## 2021-06-14 DIAGNOSIS — M7752 Other enthesopathy of left foot: Secondary | ICD-10-CM

## 2021-06-14 DIAGNOSIS — M722 Plantar fascial fibromatosis: Secondary | ICD-10-CM | POA: Diagnosis not present

## 2021-06-14 MED ORDER — TRIAMCINOLONE ACETONIDE 10 MG/ML IJ SUSP
10.0000 mg | Freq: Once | INTRAMUSCULAR | Status: AC
  Administered 2021-06-14: 10 mg

## 2021-06-14 NOTE — Progress Notes (Signed)
Subjective:  ? ?Patient ID: Michele Bean, female   DOB: 56 y.o.   MRN: [id number]  ? ?HPI ?Patient states she is developed pain in somewhat of a different area on the more proximal portion plantar heel region and has developed a lot of discomfort in the left ankle with swelling.  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 ? ?ROS ? ? ?   ?Objective:  ?Physical Exam  ?Neurovascular status intact with inflammation in the more proximal portion of the fascia bilateral with exquisite discomfort in the sinus tarsi left with moderate swelling negative Bevelyn Buckles' sign noted ? ?   ?Assessment:  ?Inflammatory capsulitis of the sinus tarsi along with fascial-like symptomatology proximal portion bilateral ? ?   ?Plan:  ?H&P x-rays reviewed sterile prep injected the sinus tarsi left 3 mg Kenalog 5 mg Xylocaine advised on orthotic usage anti-inflammatory stretch shoe gear modifications and will treat proximal plantar if symptoms persist ? ?X-rays indicate moderate flatfoot deformity minimal spur formation no indications arthritis of the subtalar joint left ?   ? ? ?

## 2022-02-11 ENCOUNTER — Other Ambulatory Visit: Payer: Self-pay | Admitting: Family Medicine

## 2022-02-11 DIAGNOSIS — Z1231 Encounter for screening mammogram for malignant neoplasm of breast: Secondary | ICD-10-CM

## 2022-04-05 ENCOUNTER — Ambulatory Visit
Admission: RE | Admit: 2022-04-05 | Discharge: 2022-04-05 | Disposition: A | Discharge status: Home / Self Care | Payer: Commercial Managed Care - PPO | Source: Ambulatory Visit

## 2022-04-05 DIAGNOSIS — Z1231 Encounter for screening mammogram for malignant neoplasm of breast: Secondary | ICD-10-CM

## 2022-09-19 ENCOUNTER — Ambulatory Visit (INDEPENDENT_AMBULATORY_CARE_PROVIDER_SITE_OTHER): Payer: Commercial Managed Care - PPO | Admitting: Podiatry

## 2022-09-19 ENCOUNTER — Encounter: Payer: Self-pay | Admitting: Podiatry

## 2022-09-19 DIAGNOSIS — M722 Plantar fascial fibromatosis: Secondary | ICD-10-CM

## 2022-09-19 DIAGNOSIS — M2041 Other hammer toe(s) (acquired), right foot: Secondary | ICD-10-CM | POA: Diagnosis not present

## 2022-09-19 NOTE — Progress Notes (Signed)
Subjective:   Patient ID: Michele Bean, female   DOB: 57 y.o.   MRN: [id number]   HPI Patient presents stating the fourth toe on her right foot has become very sore on the edge and she tried to trim it herself and also she has pain in her heel that is still present it has improved and she admits she got [facility]otics [facility]   ROS      Objective:  Physical Exam  Neurovascular status intact with keratotic inflammation fourth toe right distal lateral with rotation of the toe keratotic tissue formation pain is mild to moderate discomfort in the plantar fascia noted     Assessment:  Several problems 1 being a inflamed digit secondary to structure and keratotic tissue formation and second plantar fascial inflammation right      Plan:  H&P reviewed both conditions.  I have recommended debridement which was done courtesy today distal lateral cushioning and discussed the possibility for derotational arthroplasty.  Plantar fascia continue exercises support shoes with high heels and will be seen back as symptoms indicate

## 2022-10-21 ENCOUNTER — Encounter: Payer: Self-pay | Admitting: Podiatry

## 2022-10-21 ENCOUNTER — Ambulatory Visit: Payer: Commercial Managed Care - PPO | Admitting: Podiatry

## 2022-10-21 DIAGNOSIS — L600 Ingrowing nail: Secondary | ICD-10-CM | POA: Diagnosis not present

## 2022-10-21 DIAGNOSIS — M2041 Other hammer toe(s) (acquired), right foot: Secondary | ICD-10-CM

## 2022-10-21 NOTE — Progress Notes (Signed)
Subjective:   Patient ID: Michele Bean, female   DOB: 57 y.o.   MRN: [id number]   HPI Patient states that this fourth toe right is starting to hurt again she knows it is rotated but also feels like it is the toenail   ROS      Objective:  Physical Exam  Neurovascular status intact with patient found to have a moderate rotation of the fourth toe right also has an incurvated lateral nail bed which may be part of the pathology along with thick keratotic tissue formation     Assessment:  Combination of digital deformity with ingrown toenail deformity fourth right painful     Plan:  H&P reviewed and I have recommended correction of underlying nail deformity with consideration long-term for digital arthroplasty but if this solves the problem would be quicker healing.  I went ahead explained this to her she signed consent form I anesthetized the fourth digit 60 mg like Marcaine mixture sterile prep done using sterile instrumentation remove the lateral border exposed matrix applied phenol 3 applications 30 seconds followed by alcohol by sterile dressing gave instructions on soaks reappoint to recheck

## 2022-10-21 NOTE — Patient Instructions (Signed)

## 2023-02-20 ENCOUNTER — Other Ambulatory Visit: Payer: Self-pay | Admitting: Family Medicine

## 2023-02-20 DIAGNOSIS — Z1231 Encounter for screening mammogram for malignant neoplasm of breast: Secondary | ICD-10-CM

## 2023-04-07 ENCOUNTER — Ambulatory Visit
Admission: RE | Admit: 2023-04-07 | Discharge: 2023-04-07 | Disposition: A | Discharge status: Home / Self Care | Payer: Commercial Managed Care - PPO | Source: Ambulatory Visit | Attending: Family Medicine | Admitting: Family Medicine

## 2023-04-07 DIAGNOSIS — Z1231 Encounter for screening mammogram for malignant neoplasm of breast: Secondary | ICD-10-CM

## 2023-04-09 ENCOUNTER — Ambulatory Visit: Payer: Commercial Managed Care - PPO | Admitting: Podiatry

## 2023-05-16 ENCOUNTER — Ambulatory Visit: Payer: Commercial Managed Care - PPO | Admitting: Podiatry

## 2023-05-16 ENCOUNTER — Encounter: Payer: Self-pay | Admitting: Podiatry

## 2023-05-16 DIAGNOSIS — M722 Plantar fascial fibromatosis: Secondary | ICD-10-CM

## 2023-05-16 NOTE — Progress Notes (Signed)
 Had to leave

## 2023-08-15 ENCOUNTER — Ambulatory Visit (INDEPENDENT_AMBULATORY_CARE_PROVIDER_SITE_OTHER): Admitting: Podiatry

## 2023-08-15 ENCOUNTER — Telehealth: Payer: Self-pay | Admitting: *Deleted

## 2023-08-15 DIAGNOSIS — Z91199 Patient's noncompliance with other medical treatment and regimen due to unspecified reason: Secondary | ICD-10-CM

## 2023-08-15 NOTE — Telephone Encounter (Signed)
 I called the patient regarding her appointment.  She thinks she has Plantar Fasciitis.  I informed her once she checks in with us , we will direct her to imaging to get xrays.  I asked her to come in a few minutes early.

## 2023-08-15 NOTE — Progress Notes (Signed)
 Cancel 24 hours

## 2023-08-21 ENCOUNTER — Ambulatory Visit: Admitting: Podiatry

## 2023-08-21 ENCOUNTER — Ambulatory Visit (INDEPENDENT_AMBULATORY_CARE_PROVIDER_SITE_OTHER)

## 2023-08-21 ENCOUNTER — Ambulatory Visit (INDEPENDENT_AMBULATORY_CARE_PROVIDER_SITE_OTHER): Admitting: Podiatry

## 2023-08-21 ENCOUNTER — Encounter: Payer: Self-pay | Admitting: Podiatry

## 2023-08-21 DIAGNOSIS — M76821 Posterior tibial tendinitis, right leg: Secondary | ICD-10-CM

## 2023-08-21 DIAGNOSIS — M722 Plantar fascial fibromatosis: Secondary | ICD-10-CM | POA: Diagnosis not present

## 2023-08-21 MED ORDER — TRIAMCINOLONE ACETONIDE 10 MG/ML IJ SUSP
10.0000 mg | Freq: Once | INTRAMUSCULAR | Status: AC
  Administered 2023-08-21: 10 mg via INTRA_ARTICULAR

## 2023-08-21 MED ORDER — DICLOFENAC SODIUM 75 MG PO TBEC: 75.0000 mg | DELAYED_RELEASE_TABLET | Freq: Two times a day (BID) | ORAL | 2 refills | Status: AC

## 2023-08-21 NOTE — Progress Notes (Signed)
 Subjective:   Patient ID: Michele Bean, female   DOB: 58 y.o.   MRN: [id number]   HPI Patient states getting a lot of pain in both her feet with the left heel being worse and on the right in the side of the arch and inside of the ankle bothering her more.  She is walking more and standing more with new job   ROS      Objective:  Physical Exam  Neurovascular status intact with inflammation that is mostly in the plantar left heel and then on the right mostly in the posterior tibial tendon as it comes underneath the medial malleolus with no indication of dysfunction     Assessment:  Acute plantar fasciitis left with posterior tibial tendinitis right     Plan:  H&P reviewed both conditions and for the left sterile prep injected the fascia 3 mg Kenalog  5 mg Xylocaine  and for the right discussed tendon discussed risk and went ahead did a careful sheath injection 3 mg Dexasone Kenalog  5 mg Xylocaine .  Reviewed x-rays  X-rays indicate moderate depression of the arch no other indication of pathology

## 2023-09-04 ENCOUNTER — Encounter: Payer: Self-pay | Admitting: Podiatry

## 2023-09-04 ENCOUNTER — Ambulatory Visit (INDEPENDENT_AMBULATORY_CARE_PROVIDER_SITE_OTHER): Admitting: Podiatry

## 2023-09-04 DIAGNOSIS — M76821 Posterior tibial tendinitis, right leg: Secondary | ICD-10-CM | POA: Diagnosis not present

## 2023-09-04 DIAGNOSIS — M722 Plantar fascial fibromatosis: Secondary | ICD-10-CM | POA: Diagnosis not present

## 2023-09-04 NOTE — Progress Notes (Signed)
 Subjective:   Patient ID: Michele Bean, female   DOB: 58 y.o.   MRN: [id number]   HPI Patient states she seems to be improving but still having mild discomfort.  States she is walking with a better heel-toe gait at the current time   ROS      Objective:  Physical Exam  Neurovascular status intact with improvement of symptoms of the right plantar heel and the right medial ankle and the left plantar heel.  She still has mild discomfort but better and does have a night splint at home she has not been using     Assessment:  Improvement of posterior tibial tendinitis right plantar fasciitis left with moderate mid arch pain still noted left     Plan:  H&P discussed at great length and discussed oral medications to take as needed.  At this point she will initiate the night splint she has at home and I instructed on heat stretch therapy and utilization of ice.  I explained what we can do if symptoms persist and hopefully what I done today will be effective in knocking out her pain.  Patient is to be seen back all questions answered today

## 2024-02-17 ENCOUNTER — Other Ambulatory Visit: Payer: Self-pay | Admitting: Family Medicine

## 2024-02-17 DIAGNOSIS — Z1231 Encounter for screening mammogram for malignant neoplasm of breast: Secondary | ICD-10-CM

## 2024-04-07 ENCOUNTER — Ambulatory Visit
Admission: RE | Admit: 2024-04-07 | Discharge: 2024-04-07 | Disposition: A | Source: Ambulatory Visit | Attending: Family Medicine | Admitting: Family Medicine

## 2024-04-07 DIAGNOSIS — Z1231 Encounter for screening mammogram for malignant neoplasm of breast: Secondary | ICD-10-CM
